# Patient Record
Sex: Female | Born: 1995
Health system: Southern US, Community
[De-identification: ages and names within clinical notes are randomized; demographics above are authoritative.]

## PROBLEM LIST (undated history)

## (undated) DIAGNOSIS — G43909 Migraine, unspecified, not intractable, without status migrainosus: Secondary | ICD-10-CM

## (undated) DIAGNOSIS — K219 Gastro-esophageal reflux disease without esophagitis: Secondary | ICD-10-CM

## (undated) DIAGNOSIS — Z91018 Allergy to other foods: Secondary | ICD-10-CM

## (undated) DIAGNOSIS — F329 Major depressive disorder, single episode, unspecified: Secondary | ICD-10-CM

## (undated) DIAGNOSIS — F32A Depression, unspecified: Secondary | ICD-10-CM

## (undated) DIAGNOSIS — K529 Noninfective gastroenteritis and colitis, unspecified: Secondary | ICD-10-CM

## (undated) DIAGNOSIS — F419 Anxiety disorder, unspecified: Secondary | ICD-10-CM

## (undated) HISTORY — DX: Major depressive disorder, single episode, unspecified: F32.9

## (undated) HISTORY — DX: Anxiety disorder, unspecified: F41.9

## (undated) HISTORY — DX: Migraine, unspecified, not intractable, without status migrainosus: G43.909

## (undated) HISTORY — DX: Allergy to other foods: Z91.018

## (undated) HISTORY — DX: Noninfective gastroenteritis and colitis, unspecified: K52.9

## (undated) HISTORY — DX: Depression, unspecified: F32.A

## (undated) HISTORY — PX: OTHER SURGICAL HISTORY: SHX169

## (undated) HISTORY — DX: Gastro-esophageal reflux disease without esophagitis: K21.9

---
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
## 2014-02-03 ENCOUNTER — Other Ambulatory Visit: Payer: Self-pay | Admitting: Obstetrics and Gynecology

## 2014-02-03 NOTE — Telephone Encounter (Signed)
eScribe request from [facility] for refill on AZURETTE Last filled - 10/31/13 X 1 YEAR Last AEX - 03/30/14 Next AEX - 04/01/14  RX denied.  This was filled in 10/2013 x 1 year.

## 2014-03-08 ENCOUNTER — Other Ambulatory Visit: Payer: Self-pay | Admitting: Obstetrics and Gynecology

## 2014-03-09 NOTE — Telephone Encounter (Signed)
eScribe request from [facility] for refill on AZURETTE Last filled - 10/31/13, #1 X 11 to [facility] Last AEX - 03/30/13 Next AEX - 04/01/14 1 month supply sent to [facility].

## 2014-04-01 ENCOUNTER — Encounter: Payer: Self-pay | Admitting: Obstetrics and Gynecology

## 2014-04-01 ENCOUNTER — Ambulatory Visit (INDEPENDENT_AMBULATORY_CARE_PROVIDER_SITE_OTHER): Payer: Managed Care, Other (non HMO) | Admitting: Obstetrics and Gynecology

## 2014-04-01 VITALS — BP 110/60 | HR 82 | Temp 98.1°F | Ht 65.25 in | Wt 157.6 lb

## 2014-04-01 DIAGNOSIS — Z01419 Encounter for gynecological examination (general) (routine) without abnormal findings: Secondary | ICD-10-CM

## 2014-04-01 DIAGNOSIS — R5381 Other malaise: Secondary | ICD-10-CM

## 2014-04-01 DIAGNOSIS — Z113 Encounter for screening for infections with a predominantly sexual mode of transmission: Secondary | ICD-10-CM

## 2014-04-01 DIAGNOSIS — R5383 Other fatigue: Secondary | ICD-10-CM

## 2014-04-01 DIAGNOSIS — R635 Abnormal weight gain: Secondary | ICD-10-CM

## 2014-04-01 DIAGNOSIS — Z0000 Encounter for general adult medical examination without abnormal findings: Secondary | ICD-10-CM

## 2014-04-01 LAB — COMPREHENSIVE METABOLIC PANEL
ALK PHOS: 74 U/L (ref 39–117)
ALT: <8 U/L (ref 0–35)
AST: 14 U/L (ref 0–37)
Albumin: 3.7 g/dL (ref 3.5–5.2)
BILIRUBIN TOTAL: 0.4 mg/dL (ref 0.2–1.1)
BUN: 8 mg/dL (ref 6–23)
CO2: 25 meq/L (ref 19–32)
CREATININE: 0.7 mg/dL (ref 0.50–1.10)
Calcium: 9.1 mg/dL (ref 8.4–10.5)
Chloride: 102 mEq/L (ref 96–112)
GLUCOSE: 84 mg/dL (ref 70–99)
Potassium: 4.1 mEq/L (ref 3.5–5.3)
Sodium: 135 mEq/L (ref 135–145)
Total Protein: 6.7 g/dL (ref 6.0–8.3)

## 2014-04-01 LAB — POCT URINALYSIS DIPSTICK
Bilirubin, UA: NEGATIVE
Blood, UA: 250
Glucose, UA: NEGATIVE
KETONES UA: NEGATIVE
NITRITE UA: NEGATIVE
PH UA: 8
PROTEIN UA: NEGATIVE
Urobilinogen, UA: NEGATIVE

## 2014-04-01 LAB — LIPID PANEL
CHOL/HDL RATIO: 2.7 ratio
CHOLESTEROL: 172 mg/dL — AB (ref 0–169)
HDL: 64 mg/dL (ref >34)
LDL CALC: 85 mg/dL (ref 0–109)
TRIGLYCERIDES: 113 mg/dL (ref <150)
VLDL: 23 mg/dL (ref 0–40)

## 2014-04-01 MED ORDER — DESOGESTREL-ETHINYL ESTRADIOL 0.15-0.02/0.01 MG (21/5) PO TABS: 1.0000 | ORAL_TABLET | Freq: Every day | ORAL | Status: DC

## 2014-04-01 NOTE — Patient Instructions (Signed)

## 2014-04-01 NOTE — Progress Notes (Signed)
Patient ID: Karina RenshawShayna Riley, female   DOB: 12-27-1995, 18 y.o.   MRN: [id number] GYNECOLOGY VISIT  PC[facility]   [facility]  Referring provider:   HPI: 18 y.o.   Single  Caucasian  female   G0P0 with Patient's last menstrual period was 04/01/2014.   here for  AEX.  Less cramping on oral contraceptive pills. Bleeding improved with most cycles.  One missed pill.  Wants STD testing.   Cannot loose weight.  Fatigue.  No constipation.  No cold intolerance.  Had normal thyroid ultrasound last year.   Hospitalized for alcohol abuse in December 2014.  Was in stressful relationship.  Seeing a Veterinary surgeoncounselor.  Doing much better.  In a new and supportive relationship.  [place] is being deployed to [place].  Hgb:   12.4 Urine:  Mod. RBC's--see LMP  GYNECOLOGIC HISTORY: Patient's last menstrual period was 04/01/2014. Sexually active:  yes Partner preference: female Contraception: Ocp's & condoms    Menopausal hormone therapy: n/a DES exposure:  no  Blood transfusions:  no  Sexually transmitted diseases:   no GYN procedures and prior surgeries:  labioplasty Last mammogram: n/a                Last pap and high risk HPV testing:   n/a History of abnormal pap smear:  n/a   OB History   Grav Para Term Preterm Abortions TAB SAB Ect Mult Living   0                LIFESTYLE: Exercise:     Work horses          Tobacco:     no Alcohol:        no Drug use:    no  OTHER HEALTH MAINTENANCE: Tetanus/TDap:   Up to date with PCP Gardisil:                completed Influenza:              no Zostavax:              n/a  Bone density:        n/a Colonoscopy:        n/a  Cholesterol check:   unsure  Family History  Problem Relation Age of Onset  . Osteoarthritis Mother   . Migraines Mother   . Osteoarthritis Father   . Osteoarthritis Maternal Grandfather   . Hypertension Maternal Grandfather   . Hyperlipidemia Maternal Grandfather   . Stroke Maternal Grandfather   . Thyroid disease  Maternal Grandfather   . Throat cancer Maternal Grandfather   . Osteoarthritis Paternal Grandmother   . Osteoarthritis Paternal Grandfather     Patient Active Problem List   Diagnosis Date Noted  . Depression with suicidal ideation 10/27/2013   Past Medical History  Diagnosis Date  . GERD (gastroesophageal reflux disease)   . Anxiety   . Depression   . Migraine     Past Surgical History  Procedure Laterality Date  . Wisdom teeth removal    . Eustacian tubes as infant    . Adenoidectomy  1998  . Labioplasty Right 04/29/2013    Procedure: right labia reduction;  Surgeon: Melony OverlyBrook A Silva, MD;  Location: [facility];  Service: Gynecology;  Laterality: Right;    ALLERGIES: Review of patient's allergies indicates no known allergies.  Current Outpatient Prescriptions  Medication Sig Dispense Refill  . AZURETTE 0.15-0.02/0.01 MG (21/5) tablet TAKE ONE TABLET BY MOUTH EVERY DAY  28 tablet  0  . escitalopram (LEXAPRO) 10 MG tablet Take 1 tablet (10 mg total) by mouth daily.  30 tablet  0  . omeprazole (PRILOSEC) 10 MG capsule Take 1 capsule (10 mg total) by mouth daily.       No current facility-administered medications for this visit.     ROS:  Pertinent items are noted in HPI.  SOCIAL HISTORY:  Works in [employer].   PHYSICAL EXAMINATION:    BP 110/60  Pulse 82  Temp(Src) 98.1 F (36.7 C)  Ht 5' 5.25" (1.657 m)  Wt 157 lb 9.6 oz (71.487 kg)  BMI 26.04 kg/m2  LMP 04/01/2014   Wt Readings from Last 3 Encounters:  04/01/14 157 lb 9.6 oz (71.487 kg) (87%*, Z = 1.14)  10/01/13 144 lb (65.318 kg) (78%*, Z = 0.79)  06/08/13 152 lb (68.947 kg) (86%*, Z = 1.06)   * Growth percentiles are based on CDC 2-20 Years data.     Ht Readings from Last 3 Encounters:  04/01/14 5' 5.25" (1.657 m) (65%*, Z = 0.38)  10/27/13 6\' 4"  (1.93 m) (100%*, Z = 4.63)  10/01/13 5\' 5"  (1.651 m) (62%*, Z = 0.30)   * Growth percentiles are based on CDC 2-20 Years data.    General appearance: alert,  cooperative and appears stated age Head: Normocephalic, without obvious abnormality, atraumatic Neck: no adenopathy, supple, symmetrical, trachea midline and thyroid not enlarged, symmetric, no tenderness/mass/nodules, neck seems thick in region of thyroid. Lungs: clear to auscultation bilaterally Breasts: Inspection negative, No nipple retraction or dimpling, No nipple discharge or bleeding, No axillary or supraclavicular adenopathy, Normal to palpation without dominant masses Heart: regular rate and rhythm Abdomen: soft, non-tender; no masses,  no organomegaly Extremities: extremities normal, atraumatic, no cyanosis or edema Skin: Skin color, texture, turgor normal. No rashes or lesions Lymph nodes: Cervical, supraclavicular, and axillary nodes normal. No abnormal inguinal nodes palpated Neurologic: Grossly normal  Pelvic: External genitalia:  no lesions              Urethra:  normal appearing urethra with no masses, tenderness or lesions              Bartholins and Skenes: normal                 Vagina: normal appearing vagina with normal color and discharge, no lesions              Cervix: normal appearance              Pap and high risk HPV testing done: no.            Bimanual Exam:  Uterus:  uterus is normal size, shape, consistency and nontender                                      Adnexa: normal adnexa in size, nontender and no masses                                    ASSESSMENT  Normal gynecologic exam. Weight gain.  Fatigue.  Desire for STD testing.   PLAN  Pap smear and high risk HPV testing not indicated.  STD testing.  Azurette OCP for 1 year.  See orders.  CMP, TSH, lipid penal.  Counseled on self breast exam, Calcium  and vitamin D intake, exercise, and weight loss. Return annually or prn   An After Visit Summary was printed and given to the patient.

## 2014-04-02 LAB — HEMOGLOBIN, FINGERSTICK: HEMOGLOBIN, FINGERSTICK: 12.4 g/dL (ref 12.0–16.0)

## 2014-04-02 LAB — GC/CHLAMYDIA PROBE AMP, URINE
Chlamydia, Swab/Urine, PCR: NEGATIVE
GC Probe Amp, Urine: NEGATIVE

## 2014-04-02 LAB — TSH: TSH: 1.009 u[IU]/mL (ref 0.350–4.500)

## 2014-04-02 LAB — STD PANEL
HIV 1&2 Ab, 4th Generation: NONREACTIVE
Hepatitis B Surface Ag: NEGATIVE

## 2014-04-02 LAB — HEPATITIS C ANTIBODY: HCV Ab: NEGATIVE

## 2014-11-12 IMAGING — US US SOFT TISSUE HEAD/NECK
1 series · 14 of 25 positions shown · non-contrast
Comparison: None.

CLINICAL DATA: Thyromegaly

THYROID ULTRASOUND
TECHNIQUE: Ultrasound examination of the thyroid gland and adjacent
soft tissues was performed.

[Series 1: us soft tissue head/neck · 0.04mm/px · 14 of 36 slices shown]
[im 1/36]
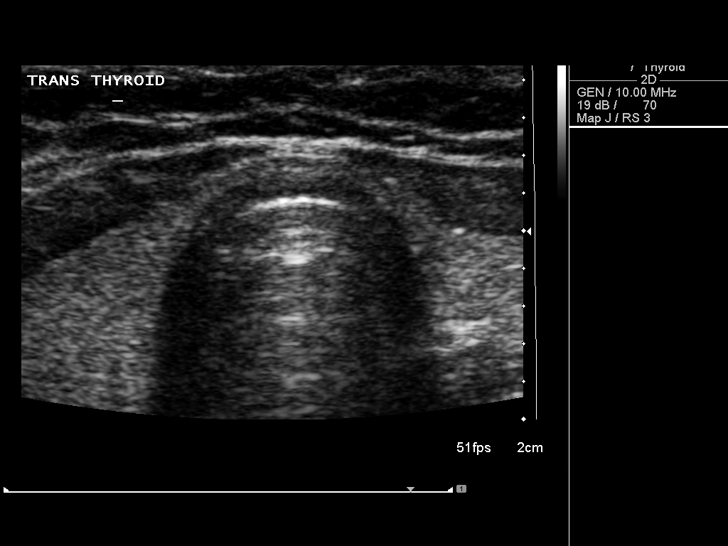
[im 3/36]
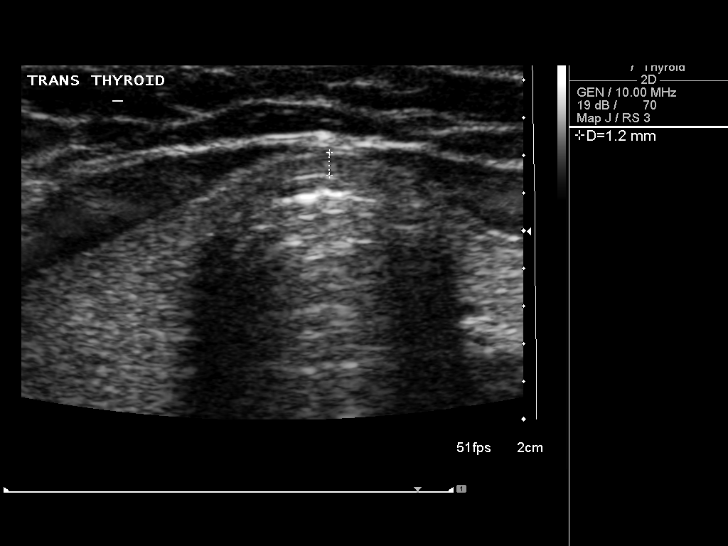
[im 6/36]
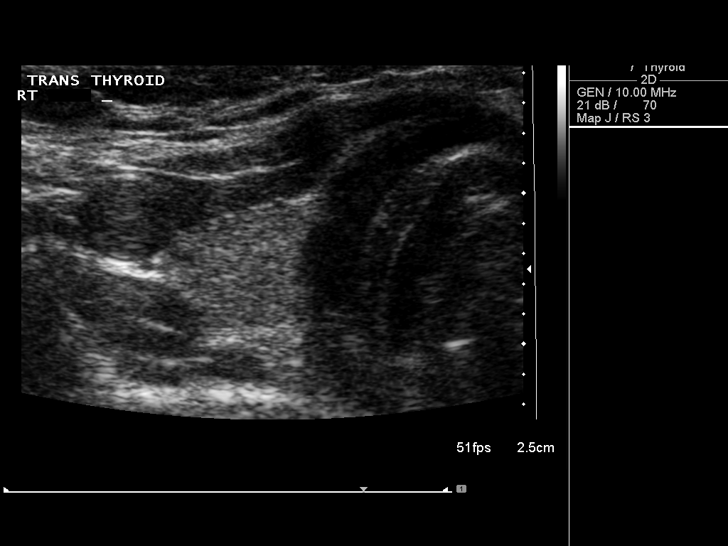
[im 9/36]
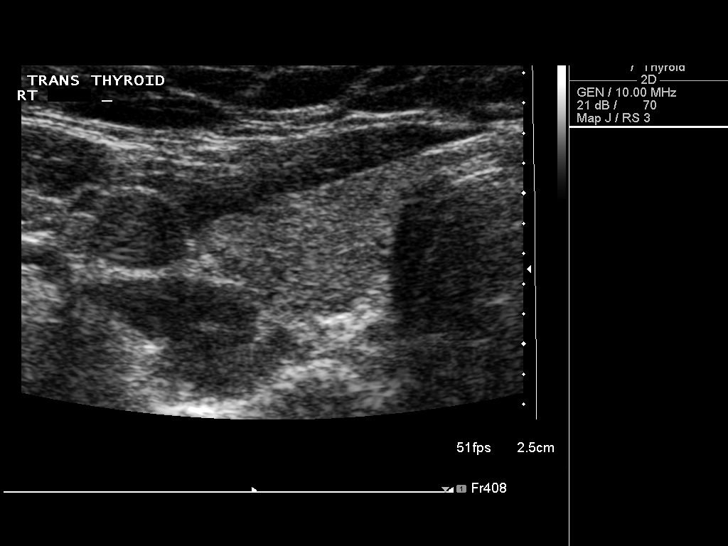
[im 12/36]
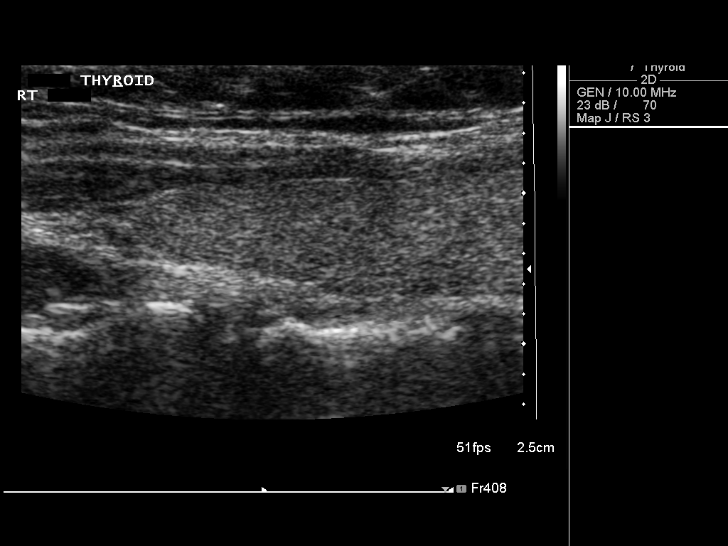
[im 14/36]
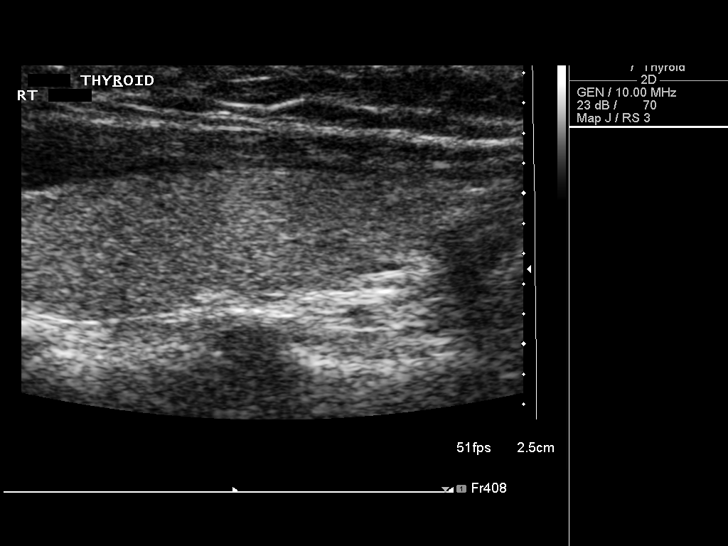
[im 17/36]
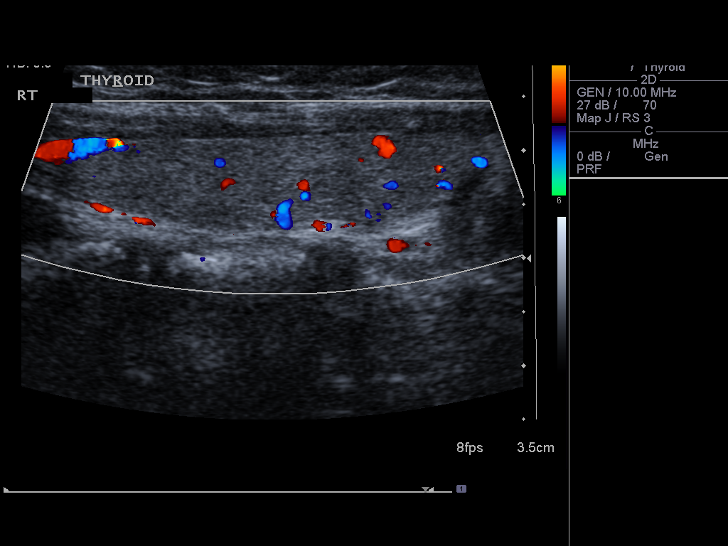
[im 19/36]
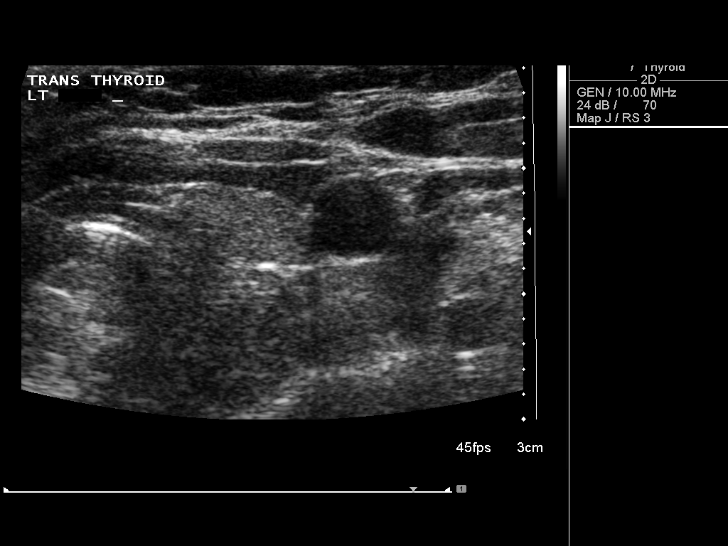
[im 22/36]
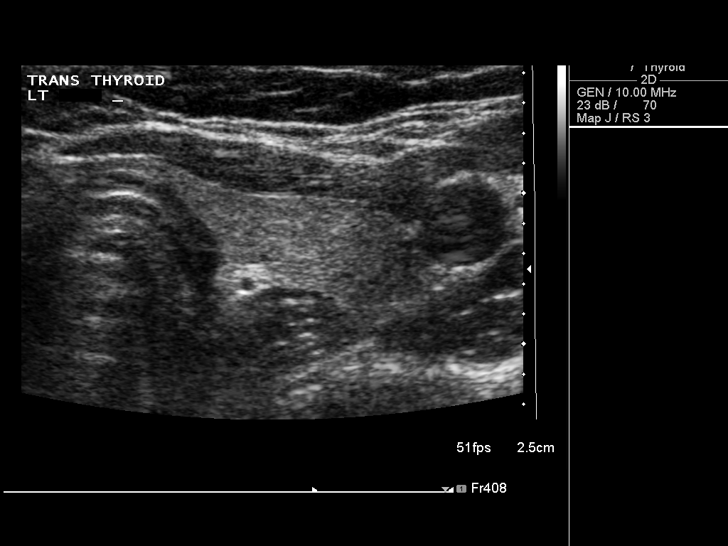
[im 24/36]
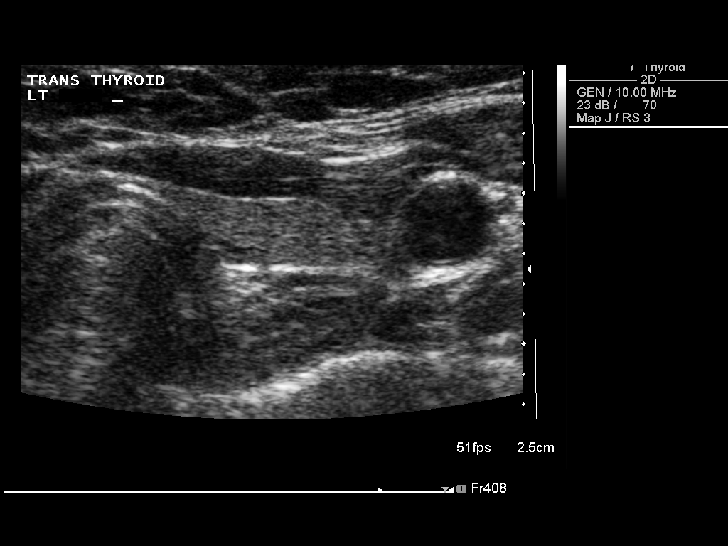
[im 27/36]
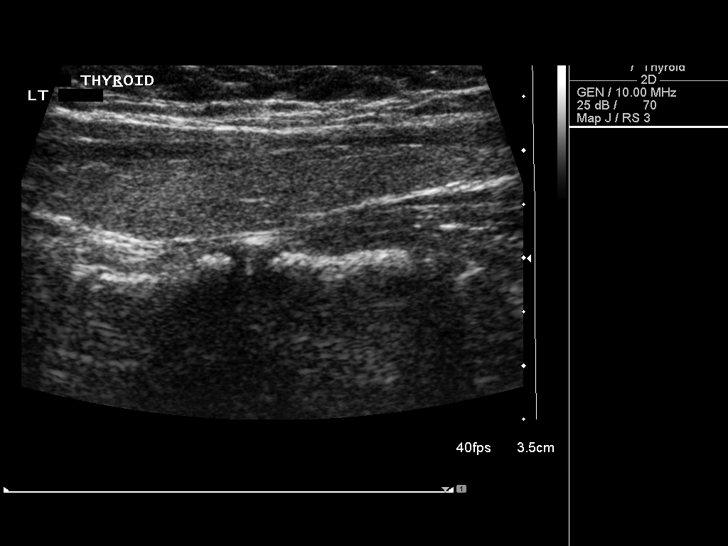
[im 30/36]
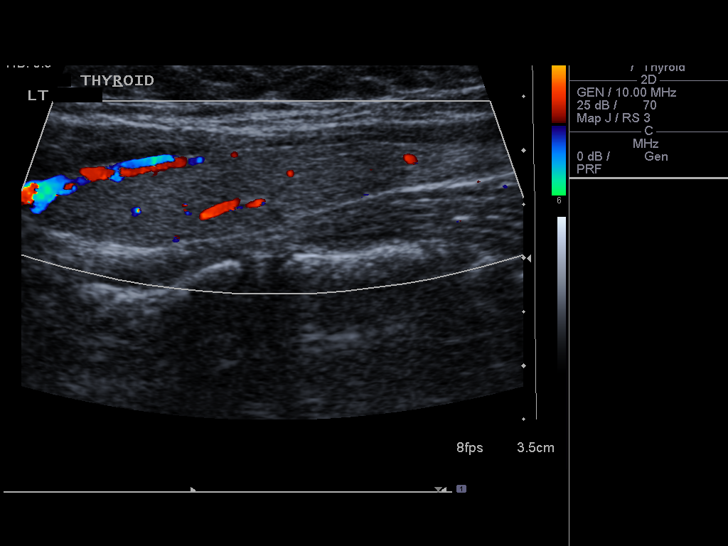
[im 33/36]
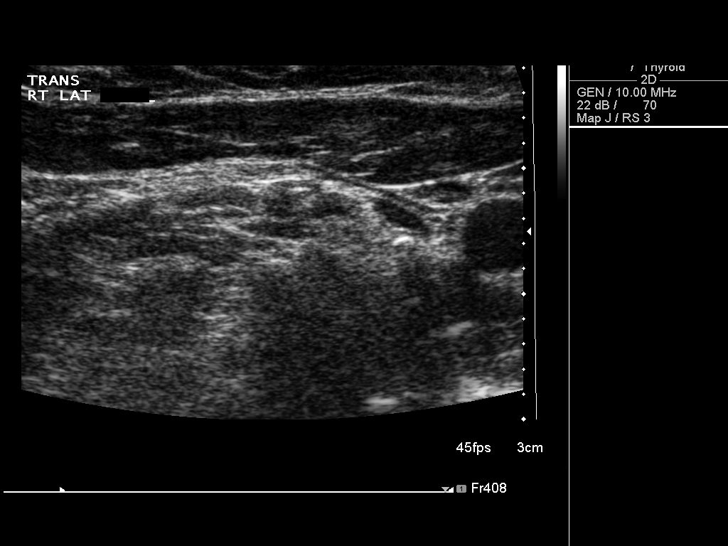
[im 36/36]
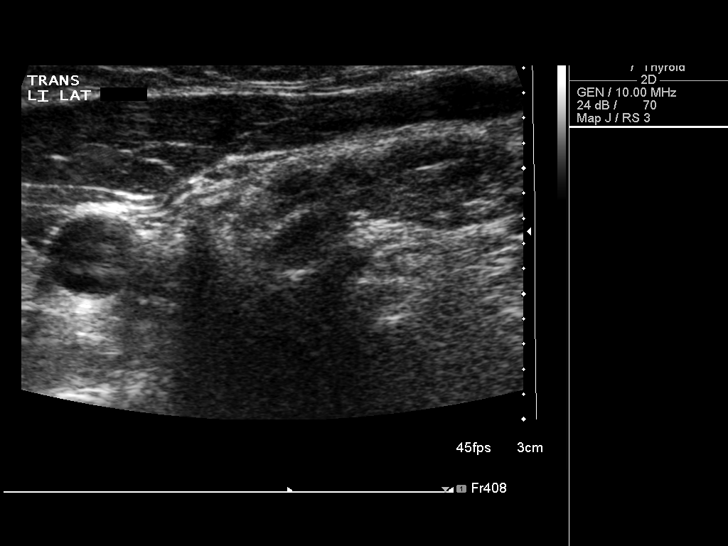

[14 of 25 positions shown; findings below may reference images not displayed]

FINDINGS: Right thyroid lobe:  9 x 13 x 42 mm, homogeneous echotexture
Left thyroid lobe:  8 x 13 x 45 mm
Isthmus:  1.2 mm in thickness

Focal nodules:  None

Lymphadenopathy:  None visualized.
IMPRESSION: 1.  Normal sized thyroid without focal lesions

## 2014-12-09 ENCOUNTER — Other Ambulatory Visit: Payer: Self-pay | Admitting: Obstetrics and Gynecology

## 2014-12-10 NOTE — Telephone Encounter (Signed)
Medication refill request: Azurette  Last AEX:  04/01/14 Next AEX: 04/13/15 Last MMG (if hormonal medication request): none Refill authorized: 04/01/14 #3packs/3 R. Patient should have enough refills until 03/2015. Rx denied

## 2015-03-01 ENCOUNTER — Other Ambulatory Visit: Payer: Self-pay | Admitting: Obstetrics and Gynecology

## 2015-03-01 NOTE — Telephone Encounter (Signed)
Medication refill request: Azurette Last AEX:  04/01/14 Dr. Edward JollySilva Next AEX: 04/13/15 Dr. Edward JollySilva  Last MMG (if hormonal medication request): none Refill authorized: 04/01/14 #3packs/ 3 R.  [facility]

## 2015-04-13 ENCOUNTER — Ambulatory Visit (INDEPENDENT_AMBULATORY_CARE_PROVIDER_SITE_OTHER): Payer: Managed Care, Other (non HMO) | Admitting: Obstetrics and Gynecology

## 2015-04-13 ENCOUNTER — Encounter: Payer: Self-pay | Admitting: Obstetrics and Gynecology

## 2015-04-13 VITALS — BP 102/60 | HR 88 | Resp 14 | Ht 66.0 in | Wt 155.0 lb

## 2015-04-13 DIAGNOSIS — Z0000 Encounter for general adult medical examination without abnormal findings: Secondary | ICD-10-CM

## 2015-04-13 DIAGNOSIS — Z113 Encounter for screening for infections with a predominantly sexual mode of transmission: Secondary | ICD-10-CM | POA: Diagnosis not present

## 2015-04-13 DIAGNOSIS — R319 Hematuria, unspecified: Secondary | ICD-10-CM

## 2015-04-13 DIAGNOSIS — Z01419 Encounter for gynecological examination (general) (routine) without abnormal findings: Secondary | ICD-10-CM

## 2015-04-13 LAB — POCT URINALYSIS DIPSTICK
Bilirubin, UA: NEGATIVE
Glucose, UA: NEGATIVE
Ketones, UA: NEGATIVE
Nitrite, UA: NEGATIVE
Protein, UA: NEGATIVE
Spec Grav, UA: 1.015
UROBILINOGEN UA: NEGATIVE
pH, UA: 6.5

## 2015-04-13 LAB — CBC
HCT: 36.8 % (ref 36.0–46.0)
Hemoglobin: 12.1 g/dL (ref 12.0–15.0)
MCH: 27.4 pg (ref 26.0–34.0)
MCHC: 32.9 g/dL (ref 30.0–36.0)
MCV: 83.3 fL (ref 78.0–100.0)
MPV: 9.5 fL (ref 8.6–12.4)
PLATELETS: 362 10*3/uL (ref 150–400)
RBC: 4.42 MIL/uL (ref 3.87–5.11)
RDW: 13.6 % (ref 11.5–15.5)
WBC: 7.8 10*3/uL (ref 4.0–10.5)

## 2015-04-13 LAB — LIPID PANEL
CHOLESTEROL: 169 mg/dL (ref 0–200)
HDL: 54 mg/dL (ref 36–76)
LDL CALC: 75 mg/dL (ref 0–99)
TRIGLYCERIDES: 201 mg/dL — AB (ref <150)
Total CHOL/HDL Ratio: 3.1 Ratio
VLDL: 40 mg/dL (ref 0–40)

## 2015-04-13 LAB — COMPREHENSIVE METABOLIC PANEL
AST: 14 U/L (ref 0–37)
Albumin: 3.9 g/dL (ref 3.5–5.2)
Alkaline Phosphatase: 80 U/L (ref 39–117)
BUN: 10 mg/dL (ref 6–23)
CHLORIDE: 103 meq/L (ref 96–112)
CO2: 23 meq/L (ref 19–32)
Calcium: 9.3 mg/dL (ref 8.4–10.5)
Creat: 0.68 mg/dL (ref 0.50–1.10)
GLUCOSE: 91 mg/dL (ref 70–99)
Potassium: 4.2 mEq/L (ref 3.5–5.3)
Sodium: 139 mEq/L (ref 135–145)
Total Bilirubin: 0.2 mg/dL (ref 0.2–1.1)
Total Protein: 7 g/dL (ref 6.0–8.3)

## 2015-04-13 LAB — HEMOGLOBIN, FINGERSTICK: Hemoglobin, fingerstick: 12.3 g/dL (ref 12.0–16.0)

## 2015-04-13 LAB — HEPATITIS C ANTIBODY: HCV Ab: NEGATIVE

## 2015-04-13 LAB — TSH: TSH: 2.881 u[IU]/mL (ref 0.350–4.500)

## 2015-04-13 MED ORDER — DESOGESTREL-ETHINYL ESTRADIOL 0.15-0.02/0.01 MG (21/5) PO TABS: 1.0000 | ORAL_TABLET | Freq: Every day | ORAL | Status: DC

## 2015-04-13 NOTE — Progress Notes (Signed)
Patient ID: Karina RenshawShayna Riley, female   DOB: 06/26/1996, 19 y.o.   MRN: [id number] 19 y.o. G0P0 Single Caucasian female here for annual exam.    Menses are monthly.  No missed pills.  Not sexually active since Christmas.  Applying [facility] [employer] now. Has been in flux with work and college plans.  Horse just recovered from surgery due to a leg injury.  She and horse are the same age and have always been together.   Recent laryngitis and ring worm.  Everything was treated with Penicillin and antifungal cream and vinegar.   Patient had normal thyroid ultrasound in 2014.  Normal TSH in 2015.   Mother having testing for potential diabetes.   PCP:   [facility]   Patient's last menstrual period was 03/30/2015.          Sexually active: Yes.    The current method of family planning is OCP (estrogen/progesterone) and condoms.    Exercising: Yes.    horse back riding, light weights and walking Smoker:  no  Health Maintenance: Pap:  Never  History of abnormal Pap:  no MMG:  never Colonoscopy:  never BMD:   never TDaP:  01/2015 Screening Labs:  Hb today: 12.3, Urine today: WBC ++ RBC ++   reports that she has never smoked. She has never used smokeless tobacco. She reports that she does not drink alcohol or use illicit drugs.  Past Medical History  Diagnosis Date  . GERD (gastroesophageal reflux disease)   . Anxiety   . Depression   . Migraine     Past Surgical History  Procedure Laterality Date  . Wisdom teeth removal    . Eustacian tubes as infant    . Adenoidectomy  1998  . Labioplasty Right 04/29/2013    Procedure: right labia reduction;  Surgeon: Melony OverlyBrook A Silva, MD;  Location: [facility];  Service: Gynecology;  Laterality: Right;    Current Outpatient Prescriptions  Medication Sig Dispense Refill  . AZURETTE 0.15-0.02/0.01 MG (21/5) tablet TAKE ONE TABLET BY MOUTH DAILY 84 tablet 0  . omeprazole (PRILOSEC) 10 MG capsule Take 1 capsule (10 mg total) by mouth daily.     No current  facility-administered medications for this visit.    Family History  Problem Relation Age of Onset  . Osteoarthritis Mother   . Migraines Mother   . Diabetes Mother   . Osteoarthritis Father   . Osteoarthritis Maternal Grandfather   . Hypertension Maternal Grandfather   . Hyperlipidemia Maternal Grandfather   . Stroke Maternal Grandfather   . Thyroid disease Maternal Grandfather   . Throat cancer Maternal Grandfather   . Osteoarthritis Paternal Grandmother   . Osteoarthritis Paternal Grandfather     ROS:  Pertinent items are noted in HPI.  Otherwise, a comprehensive ROS was negative.  Exam:   BP 102/60 mmHg  Pulse 88  Resp 14  Ht 5\' 6"  (1.676 m)  Wt 155 lb (70.308 kg)  BMI 25.03 kg/m2  LMP 03/30/2015    General appearance: alert, cooperative and appears stated age Head: Normocephalic, without obvious abnormality, atraumatic Neck: no adenopathy, supple, symmetrical, trachea midline and thyroid enlarged Lungs: clear to auscultation bilaterally Breasts: normal appearance, no masses or tenderness, Inspection negative, No nipple retraction or dimpling, No nipple discharge or bleeding, No axillary or supraclavicular adenopathy Heart: regular rate and rhythm Abdomen: soft, non-tender; bowel sounds normal; no masses,  no organomegaly Extremities: extremities normal, atraumatic, no cyanosis or edema Skin: Skin color, texture, turgor normal. No rashes  or lesions Lymph nodes: Cervical, supraclavicular, and axillary nodes normal. No abnormal inguinal nodes palpated Neurologic: Grossly normal  Pelvic: External genitalia:  no lesions              Urethra:  normal appearing urethra with no masses, tenderness or lesions              Bartholins and Skenes: normal                 Vagina: normal appearing vagina with normal color and discharge, no lesions              Cervix: no lesions              Pap taken: No. Bimanual Exam:  Uterus:  normal size, contour, position, consistency,  mobility, non-tender              Adnexa: normal adnexa and no mass, fullness, tenderness              Rectovaginal: No..    Chaperone was present for exam.  Assessment:   Well woman visit with normal exam. Abnormal urine.  Asymptomatic. Need for STD testing.  Thyromegaly on exam.  Normal ultrasound. Normal previous TSH testing.  Situational stress.   Plan: Yearly mammogram recommended after age 24.  Recommended self breast exam.  Pap and HR HPV as above. Discussed regular exercise program including cardiovascular and weight bearing exercise. Labs performed.  Yes.  .   See orders. Refills given on medications.  Yes.  .  See orders. No thyroid ultrasound needed. Support given.  Follow up annually and prn.      After visit summary provided.

## 2015-04-13 NOTE — Patient Instructions (Signed)

## 2015-04-14 LAB — STD PANEL
HIV: NONREACTIVE
Hepatitis B Surface Ag: NEGATIVE

## 2015-04-14 LAB — URINE CULTURE

## 2015-04-14 LAB — URINALYSIS, MICROSCOPIC ONLY
CASTS: NONE SEEN
Crystals: NONE SEEN
WBC, UA: >50 WBC/hpf — AB (ref <3)

## 2015-04-15 ENCOUNTER — Telehealth: Payer: Self-pay

## 2015-04-15 LAB — IPS N GONORRHOEA AND CHLAMYDIA BY PCR

## 2015-04-15 NOTE — Telephone Encounter (Signed)
Spoke with patient. Advised of results and message as seen below from Dr.Silva. Patient is agreeable and verbalizes understanding.  Routing to provider for final review. Patient agreeable to disposition. Will close encounter   

## 2015-04-15 NOTE — Telephone Encounter (Signed)
-----   Message from Patton SallesBrook E Amundson C Silva, MD sent at 04/14/2015  9:50 PM EDT ----- Please inform patient of cholesterol panel showing elevated triglycerides.  Ways to lower this are to increase exercise and follow a diet low in fats. (Doing fasting labs also helps!) The actual cholesterol numbers were normal.    The thyroid, blood chemistries, blood counts, and blood STD testing were all negative and normal.  The final urine culture and GC/CT are pending.   Cc - Claudette LawsAmanda Dixon

## 2016-02-03 ENCOUNTER — Other Ambulatory Visit: Payer: Self-pay | Admitting: Obstetrics and Gynecology

## 2016-02-03 NOTE — Telephone Encounter (Signed)
Medication refill request: OCP Last AEX:  04/13/15 Dr. Edward JollySilva Next AEX: 04/13/16  Last MMG (if hormonal medication request): none Refill authorized: 04/13/15 #84tab/3R. To [facility]

## 2016-04-13 ENCOUNTER — Ambulatory Visit (INDEPENDENT_AMBULATORY_CARE_PROVIDER_SITE_OTHER): Payer: BLUE CROSS/BLUE SHIELD | Admitting: Obstetrics and Gynecology

## 2016-04-13 ENCOUNTER — Encounter: Payer: Self-pay | Admitting: Obstetrics and Gynecology

## 2016-04-13 VITALS — BP 102/70 | HR 90 | Resp 14 | Ht 66.0 in | Wt 161.8 lb

## 2016-04-13 DIAGNOSIS — Z113 Encounter for screening for infections with a predominantly sexual mode of transmission: Secondary | ICD-10-CM

## 2016-04-13 DIAGNOSIS — Z01419 Encounter for gynecological examination (general) (routine) without abnormal findings: Secondary | ICD-10-CM | POA: Diagnosis not present

## 2016-04-13 DIAGNOSIS — Z0000 Encounter for general adult medical examination without abnormal findings: Secondary | ICD-10-CM

## 2016-04-13 LAB — CBC
HEMATOCRIT: 35.5 % (ref 35.0–45.0)
Hemoglobin: 11.4 g/dL — ABNORMAL LOW (ref 11.7–15.5)
MCH: 27.1 pg (ref 27.0–33.0)
MCHC: 32.1 g/dL (ref 32.0–36.0)
MCV: 84.3 fL (ref 80.0–100.0)
MPV: 9.7 fL (ref 7.5–12.5)
Platelets: 344 10*3/uL (ref 140–400)
RBC: 4.21 MIL/uL (ref 3.80–5.10)
RDW: 13.5 % (ref 11.0–15.0)
WBC: 9.7 10*3/uL (ref 3.8–10.8)

## 2016-04-13 LAB — POCT URINALYSIS DIPSTICK
Bilirubin, UA: NEGATIVE
GLUCOSE UA: NEGATIVE
Ketones, UA: NEGATIVE
Leukocytes, UA: NEGATIVE
NITRITE UA: NEGATIVE
PROTEIN UA: NEGATIVE
RBC UA: NEGATIVE
UROBILINOGEN UA: NEGATIVE
pH, UA: 5

## 2016-04-13 MED ORDER — LEVONORGESTREL-ETHINYL ESTRAD 0.1-20 MG-MCG PO TABS: 1.0000 | ORAL_TABLET | Freq: Every day | ORAL | Status: DC

## 2016-04-13 NOTE — Patient Instructions (Signed)
Health Maintenance, Female Adopting a healthy lifestyle and getting preventive care can go a long way to promote health and wellness. Talk with your health care provider about what schedule of regular examinations is right for you. This is a good chance for you to check in with your provider about disease prevention and staying healthy. In between checkups, there are plenty of things you can do on your own. Experts have done a lot of research about which lifestyle changes and preventive measures are most likely to keep you healthy. Ask your health care provider for more information. WEIGHT AND DIET  Eat a healthy diet  Be sure to include plenty of vegetables, fruits, low-fat dairy products, and lean protein.  Do not eat a lot of foods high in solid fats, added sugars, or salt.  Get regular exercise. This is one of the most important things you can do for your health.  Most adults should exercise for at least 150 minutes each week. The exercise should increase your heart rate and make you sweat (moderate-intensity exercise).  Most adults should also do strengthening exercises at least twice a week. This is in addition to the moderate-intensity exercise.  Maintain a healthy weight  Body mass index (BMI) is a measurement that can be used to identify possible weight problems. It estimates body fat based on height and weight. Your health care provider can help determine your BMI and help you achieve or maintain a healthy weight.  For females 20 years of age and older:   A BMI below 18.5 is considered underweight.  A BMI of 18.5 to 24.9 is normal.  A BMI of 25 to 29.9 is considered overweight.  A BMI of 30 and above is considered obese.  Watch levels of cholesterol and blood lipids  You should start having your blood tested for lipids and cholesterol at 20 years of age, then have this test every 5 years.  You may need to have your cholesterol levels checked more often if:  Your lipid  or cholesterol levels are high.  You are older than 21 years of age.  You are at high risk for heart disease.  CANCER SCREENING   Lung Cancer  Lung cancer screening is recommended for adults 55-80 years old who are at high risk for lung cancer because of a history of smoking.  A yearly low-dose CT scan of the lungs is recommended for people who:  Currently smoke.  Have quit within the past 15 years.  Have at least a 30-pack-year history of smoking. A pack year is smoking an average of one pack of cigarettes a day for 1 year.  Yearly screening should continue until it has been 15 years since you quit.  Yearly screening should stop if you develop a health problem that would prevent you from having lung cancer treatment.  Breast Cancer  Practice breast self-awareness. This means understanding how your breasts normally appear and feel.  It also means doing regular breast self-exams. Let your health care provider know about any changes, no matter how small.  If you are in your 20s or 30s, you should have a clinical breast exam (CBE) by a health care provider every 1-3 years as part of a regular health exam.  If you are 40 or older, have a CBE every year. Also consider having a breast X-ray (mammogram) every year.  If you have a family history of breast cancer, talk to your health care provider about genetic screening.  If you   are at high risk for breast cancer, talk to your health care provider about having an MRI and a mammogram every year.  Breast cancer gene (BRCA) assessment is recommended for women who have family members with BRCA-related cancers. BRCA-related cancers include:  Breast.  Ovarian.  Tubal.  Peritoneal cancers.  Results of the assessment will determine the need for genetic counseling and BRCA1 and BRCA2 testing. Cervical Cancer Your health care provider may recommend that you be screened regularly for cancer of the pelvic organs (ovaries, uterus, and  vagina). This screening involves a pelvic examination, including checking for microscopic changes to the surface of your cervix (Pap test). You may be encouraged to have this screening done every 3 years, beginning at age 21.  For women ages 30-65, health care providers may recommend pelvic exams and Pap testing every 3 years, or they may recommend the Pap and pelvic exam, combined with testing for human papilloma virus (HPV), every 5 years. Some types of HPV increase your risk of cervical cancer. Testing for HPV may also be done on women of any age with unclear Pap test results.  Other health care providers may not recommend any screening for nonpregnant women who are considered low risk for pelvic cancer and who do not have symptoms. Ask your health care provider if a screening pelvic exam is right for you.  If you have had past treatment for cervical cancer or a condition that could lead to cancer, you need Pap tests and screening for cancer for at least 20 years after your treatment. If Pap tests have been discontinued, your risk factors (such as having a new sexual partner) need to be reassessed to determine if screening should resume. Some women have medical problems that increase the chance of getting cervical cancer. In these cases, your health care provider may recommend more frequent screening and Pap tests. Colorectal Cancer  This type of cancer can be detected and often prevented.  Routine colorectal cancer screening usually begins at 21 years of age and continues through 21 years of age.  Your health care provider may recommend screening at an earlier age if you have risk factors for colon cancer.  Your health care provider may also recommend using home test kits to check for hidden blood in the stool.  A small camera at the end of a tube can be used to examine your colon directly (sigmoidoscopy or colonoscopy). This is done to check for the earliest forms of colorectal  cancer.  Routine screening usually begins at age 50.  Direct examination of the colon should be repeated every 5-10 years through 21 years of age. However, you may need to be screened more often if early forms of precancerous polyps or small growths are found. Skin Cancer  Check your skin from head to toe regularly.  Tell your health care provider about any new moles or changes in moles, especially if there is a change in a mole's shape or color.  Also tell your health care provider if you have a mole that is larger than the size of a pencil eraser.  Always use sunscreen. Apply sunscreen liberally and repeatedly throughout the day.  Protect yourself by wearing long sleeves, pants, a wide-brimmed hat, and sunglasses whenever you are outside. HEART DISEASE, DIABETES, AND HIGH BLOOD PRESSURE   High blood pressure causes heart disease and increases the risk of stroke. High blood pressure is more likely to develop in:  People who have blood pressure in the high end   of the normal range (130-139/85-89 mm Hg).  People who are overweight or obese.  People who are African American.  If you are 38-23 years of age, have your blood pressure checked every 3-5 years. If you are 61 years of age or older, have your blood pressure checked every year. You should have your blood pressure measured twice--once when you are at a hospital or clinic, and once when you are not at a hospital or clinic. Record the average of the two measurements. To check your blood pressure when you are not at a hospital or clinic, you can use:  An automated blood pressure machine at a pharmacy.  A home blood pressure monitor.  If you are between 45 years and 39 years old, ask your health care provider if you should take aspirin to prevent strokes.  Have regular diabetes screenings. This involves taking a blood sample to check your fasting blood sugar level.  If you are at a normal weight and have a low risk for diabetes,  have this test once every three years after 21 years of age.  If you are overweight and have a high risk for diabetes, consider being tested at a younger age or more often. PREVENTING INFECTION  Hepatitis B  If you have a higher risk for hepatitis B, you should be screened for this virus. You are considered at high risk for hepatitis B if:  You were born in a country where hepatitis B is common. Ask your health care provider which countries are considered high risk.  Your parents were born in a high-risk country, and you have not been immunized against hepatitis B (hepatitis B vaccine).  You have HIV or AIDS.  You use needles to inject street drugs.  You live with someone who has hepatitis B.  You have had sex with someone who has hepatitis B.  You get hemodialysis treatment.  You take certain medicines for conditions, including cancer, organ transplantation, and autoimmune conditions. Hepatitis C  Blood testing is recommended for:  Everyone born from [birth year] through 1966.  Anyone with known risk factors for hepatitis C. Sexually transmitted infections (STIs)  You should be screened for sexually transmitted infections (STIs) including gonorrhea and chlamydia if:  You are sexually active and are younger than 21 years of age.  You are older than 21 years of age and your health care provider tells you that you are at risk for this type of infection.  Your sexual activity has changed since you were last screened and you are at an increased risk for chlamydia or gonorrhea. Ask your health care provider if you are at risk.  If you do not have HIV, but are at risk, it may be recommended that you take a prescription medicine daily to prevent HIV infection. This is called pre-exposure prophylaxis (PrEP). You are considered at risk if:  You are sexually active and do not regularly use condoms or know the HIV status of your partner(s).  You take drugs by injection.  You are sexually  active with a partner who has HIV. Talk with your health care provider about whether you are at high risk of being infected with HIV. If you choose to begin PrEP, you should first be tested for HIV. You should then be tested every 3 months for as long as you are taking PrEP.  PREGNANCY   If you are premenopausal and you may become pregnant, ask your health care provider about preconception counseling.  If you may  become pregnant, take 400 to 800 micrograms (mcg) of folic acid every day.  If you want to prevent pregnancy, talk to your health care provider about birth control (contraception). OSTEOPOROSIS AND MENOPAUSE   Osteoporosis is a disease in which the bones lose minerals and strength with aging. This can result in serious bone fractures. Your risk for osteoporosis can be identified using a bone density scan.  If you are 61 years of age or older, or if you are at risk for osteoporosis and fractures, ask your health care provider if you should be screened.  Ask your health care provider whether you should take a calcium or vitamin D supplement to lower your risk for osteoporosis.  Menopause may have certain physical symptoms and risks.  Hormone replacement therapy may reduce some of these symptoms and risks. Talk to your health care provider about whether hormone replacement therapy is right for you.  HOME CARE INSTRUCTIONS   Schedule regular health, dental, and eye exams.  Stay current with your immunizations.   Do not use any tobacco products including cigarettes, chewing tobacco, or electronic cigarettes.  If you are pregnant, do not drink alcohol.  If you are breastfeeding, limit how much and how often you drink alcohol.  Limit alcohol intake to no more than 1 drink per day for nonpregnant women. One drink equals 12 ounces of beer, 5 ounces of wine, or 1 ounces of hard liquor.  Do not use street drugs.  Do not share needles.  Ask your health care provider for help if  you need support or information about quitting drugs.  Tell your health care provider if you often feel depressed.  Tell your health care provider if you have ever been abused or do not feel safe at home.   This information is not intended to replace advice given to you by your health care provider. Make sure you discuss any questions you have with your health care provider.   Document Released: 05/14/2011 Document Revised: 11/19/2014 Document Reviewed: 09/30/2013 Elsevier Interactive Patient Education [employer].

## 2016-04-13 NOTE — Progress Notes (Signed)
Patient ID: Karina Riley, female   DOB: [birth year], 20 y.o.   MRN: [id number] 20 y.o. G0P0 Single Caucasian female here for annual exam.    Patient treated for a spider bite the first of May by PCP.  She is now complaining of rash and bites frequently on legs which she feels is coming from work at [employer].  On [facility] OCPs. Menses are occurring the first week of the pack. Last  7 - 8 days.  Bleeding is heavier now.  Changes 3 times per day.   Gaining weight.   Wants STD testing.  Last partner was unfaithful.  Doing summer session at school. Going to school and working two jobs.   PCP: Deboraha Sprang at [facility]   Patient's last menstrual period was 04/02/2016 (exact date).     Period Cycle (Days): 30 Period Duration (Days): 7-8 days Period Pattern: Regular Menstrual Flow: Heavy Menstrual Control: Tampon, Maxi pad Menstrual Control Change Freq (Hours): every 6 hours(overnight pads Dysmenorrhea: (!) Moderate Dysmenorrhea Symptoms: Cramping     Sexually active: No. female The current method of family planning is OCP (estrogen/progesterone)--Kariva.    Exercising: Yes.    rides horses. Smoker:  no  Health Maintenance: Pap:  Never History of abnormal Pap:  n/a MMG:  n/a Colonoscopy:  n/a BMD:   n/a  Result  n/a TDaP:  01/2015 Gardasil:   Yes, 2014  : Screening Labs:  Hb today: 11.5, Urine today: Neg   reports that she has never smoked. She has never used smokeless tobacco. She reports that she does not drink alcohol or use illicit drugs.  Past Medical History  Diagnosis Date  . GERD (gastroesophageal reflux disease)   . Anxiety   . Depression   . Migraine     Past Surgical History  Procedure Laterality Date  . Wisdom teeth removal    . Eustacian tubes as infant    . Adenoidectomy  1998  . Labioplasty Right 04/29/2013    Procedure: right labia reduction;  Surgeon: Melony Overly, MD;  Location: [facility];  Service: Gynecology;  Laterality: Right;    Current Outpatient  Prescriptions  Medication Sig Dispense Refill  . desogestrel-ethinyl estradiol (AZURETTE) 0.15-0.02/0.01 MG (21/5) tablet Take 1 tablet by mouth daily. 84 tablet 3  . omeprazole (PRILOSEC) 10 MG capsule Take 1 capsule (10 mg total) by mouth daily.     No current facility-administered medications for this visit.    Family History  Problem Relation Age of Onset  . Osteoarthritis Mother   . Migraines Mother   . Diabetes Mother   . Osteoarthritis Father   . Osteoarthritis Maternal Grandfather   . Hypertension Maternal Grandfather   . Hyperlipidemia Maternal Grandfather   . Stroke Maternal Grandfather   . Thyroid disease Maternal Grandfather   . Throat cancer Maternal Grandfather   . Osteoarthritis Paternal Grandmother   . Osteoarthritis Paternal Grandfather     ROS:  Pertinent items are noted in HPI.  Otherwise, a comprehensive ROS was negative.  Exam:   BP 102/70 mmHg  Pulse 90  Resp 14  Ht  (1.676 m)  Wt 161 lb 12.8 oz (73.392 kg)  BMI 26.13 kg/m2  LMP 04/02/2016 (Exact Date)    General appearance: alert, cooperative and appears stated age Head: Normocephalic, without obvious abnormality, atraumatic Neck: no adenopathy, supple, symmetrical, trachea midline and thyroid normal to inspection and palpation Lungs: clear to auscultation bilaterally Breasts: normal appearance, no masses or tenderness, Inspection negative, No nipple retraction  or dimpling, No nipple discharge or bleeding, No axillary or supraclavicular adenopathy Heart: regular rate and rhythm Abdomen: incisions:  No.    , soft, non-tender; no masses, no organomegaly Extremities: extremities normal, atraumatic, no cyanosis or edema Skin: Skin color, texture, turgor normal.  Rash around ankles. Lymph nodes: Cervical, supraclavicular, and axillary nodes normal. No abnormal inguinal nodes palpated Neurologic: Grossly normal  Pelvic: External genitalia:  no lesions              Urethra:  normal appearing  urethra with no masses, tenderness or lesions              Bartholins and Skenes: normal                 Vagina: normal appearing vagina with normal color and discharge, no lesions              Cervix: no lesions              Pap taken: No. Bimanual Exam:  Uterus:  normal size, contour, position, consistency, mobility, non-tender              Adnexa: no mass, fullness, tenderness           Chaperone was present for exam.  Assessment:   Well woman visit with normal exam. Weight gain and long cycles on current OCP. Desire for STD testing.   Plan: Yearly mammogram recommended after age 21.  Recommended self breast exam.  Pap and HR HPV as above. Discussed Calcium, Vitamin D, regular exercise program including cardiovascular and weight bearing exercise. Discussed weight loss through diet and exercise. Labs performed.  Yes.  .   See orders.  STD screening. Prescription medication(s) given.  Yes.  .  See orders.  Switch to Alesse OCPs.  Call for any problems with new pills. Benadryl cream or hydrocortisone cream to skin on ankles. Follow up annually and prn.       After visit summary provided.

## 2016-04-14 LAB — STD PANEL
HEP B S AG: NEGATIVE
HIV: NONREACTIVE

## 2016-04-15 LAB — GC/CHLAMYDIA PROBE AMP
CT Probe RNA: NOT DETECTED
GC PROBE AMP APTIMA: NOT DETECTED

## 2016-05-21 ENCOUNTER — Encounter: Payer: Self-pay | Admitting: Obstetrics and Gynecology

## 2016-05-21 ENCOUNTER — Telehealth: Payer: Self-pay | Admitting: Obstetrics and Gynecology

## 2016-05-21 ENCOUNTER — Ambulatory Visit (INDEPENDENT_AMBULATORY_CARE_PROVIDER_SITE_OTHER): Payer: BLUE CROSS/BLUE SHIELD | Admitting: Obstetrics and Gynecology

## 2016-05-21 VITALS — Ht 66.0 in | Wt 160.8 lb

## 2016-05-21 DIAGNOSIS — N764 Abscess of vulva: Secondary | ICD-10-CM

## 2016-05-21 MED ORDER — AMOXICILLIN-POT CLAVULANATE 875-125 MG PO TABS: 1.0000 | ORAL_TABLET | Freq: Two times a day (BID) | ORAL | Status: DC

## 2016-05-21 NOTE — Telephone Encounter (Signed)
Patient is having a lot of pain thinks it may be an ingrown hair around her vagina.

## 2016-05-21 NOTE — Telephone Encounter (Signed)
Patient complaints of two days of vaginal pain. She notes area on labia to be warm and tender, hard to walk. No STD concerns. No fevers, but feels warm to the touch in vaginal area.  Office visit today with Dr. Edward JollySilva at 1500 scheduled and patient agreeable.  Routing to provider for final review. Patient agreeable to disposition. Will close encounter.

## 2016-05-21 NOTE — Progress Notes (Signed)
Patient ID: Karina RenshawShayna Riley, female   DOB: [birth year], 20 y.o.   MRN: [id number] GYNECOLOGY  VISIT   HPI: 20 y.o.   Single  Caucasian  female   G0P0 with No LMP recorded.   here for evaluation of left vulvar mass which is very painful.   Not draining.  No fever.   Rides horses.   GYNECOLOGIC HISTORY: No LMP recorded. Contraception: OCPs--Kariva  Menopausal hormone therapy:  n/a Last mammogram:  n/a Last pap smear:   n/a        OB History    Gravida Para Term Preterm AB TAB SAB Ectopic Multiple Living   0                  Patient Active Problem List   Diagnosis Date Noted  . Depression with suicidal ideation 10/27/2013    Past Medical History  Diagnosis Date  . GERD (gastroesophageal reflux disease)   . Anxiety   . Depression   . Migraine     Past Surgical History  Procedure Laterality Date  . Wisdom teeth removal    . Eustacian tubes as infant    . Adenoidectomy  1998  . Labioplasty Right 04/29/2013    Procedure: right labia reduction;  Surgeon: Melony OverlyBrook A Silva, MD;  Location: [facility];  Service: Gynecology;  Laterality: Right;    Current Outpatient Prescriptions  Medication Sig Dispense Refill  . levonorgestrel-ethinyl estradiol (AVIANE,ALESSE,LESSINA) 0.1-20 MG-MCG tablet Take 1 tablet by mouth daily. 3 Package 3  . omeprazole (PRILOSEC) 10 MG capsule Take 1 capsule (10 mg total) by mouth daily.     No current facility-administered medications for this visit.     ALLERGIES: Review of patient's allergies indicates no known allergies.  Family History  Problem Relation Age of Onset  . Osteoarthritis Mother   . Migraines Mother   . Diabetes Mother   . Osteoarthritis Father   . Osteoarthritis Maternal Grandfather   . Hypertension Maternal Grandfather   . Hyperlipidemia Maternal Grandfather   . Stroke Maternal Grandfather   . Thyroid disease Maternal Grandfather   . Throat cancer Maternal Grandfather   . Osteoarthritis Paternal Grandmother   . Osteoarthritis  Paternal Grandfather     Social History   Social History  . Marital Status: Single    Spouse Name: N/A  . Number of Children: N/A  . Years of Education: N/A   Occupational History  . Not on file.   Social History Main Topics  . Smoking status: Never Smoker   . Smokeless tobacco: Never Used  . Alcohol Use: No  . Drug Use: No  . Sexual Activity: No     Comment: Garnette Scheuermann[facility]   Other Topics Concern  . Not on file   Social History Narrative    ROS:  Pertinent items are noted in HPI.  PHYSICAL EXAMINATION:    Ht 5\' 6"  (1.676 m)  Wt 160 lb 12.8 oz (72.938 kg)  BMI 25.97 kg/m2    General appearance: alert, cooperative and appears stated age   Pelvic: External genitalia:   2 cm abscess of left superior vulva.  Procedure - I and D of vulvar abscess.  Verbal consent for procedure.  Sterile prep with betadine.  Local 1% lidocaine - [id number], exp 01/10/17.  Incision with 11 blade scalpel.  Pus drained.  Wound culture performed.  Abscess broken up with hemostat.  Area dressed with sterile gauze. No complications.  Minimal EBL.  Chaperone was present for exam.  ASSESSMENT  Vulvar abscess drained.   PLAN  Wound cx.  Augmentin po bid for 7 days.  I am not giving doxycycline or bactrim as the patient [place].  I am concerned about sun exposure of these. Use occlusive bandage on the area.  Follow up in one week.  Call if area worsens.    An After Visit Summary was printed and given to the patient.  __15____ minutes face to face time of which over 50% was spent in counseling.

## 2016-05-24 LAB — WOUND CULTURE: Gram Stain: NONE SEEN

## 2016-05-30 ENCOUNTER — Encounter: Payer: Self-pay | Admitting: Obstetrics and Gynecology

## 2016-05-30 ENCOUNTER — Ambulatory Visit (INDEPENDENT_AMBULATORY_CARE_PROVIDER_SITE_OTHER): Payer: BLUE CROSS/BLUE SHIELD | Admitting: Obstetrics and Gynecology

## 2016-05-30 VITALS — BP 112/70 | HR 84 | Resp 16 | Wt 161.0 lb

## 2016-05-30 DIAGNOSIS — N764 Abscess of vulva: Secondary | ICD-10-CM

## 2016-05-30 NOTE — Progress Notes (Signed)
GYNECOLOGY  VISIT   HPI: 20 y.o.   Single  Caucasian  female   G0P0 with Patient's last menstrual period was 05/27/2016.   here for follow up vulvar abscess    Had I and D of abscess and took one week of Augmentin.  Feeling much better overall.  Wound cx - no dominant organisms.   Broke her toe last night? Left foot is really sore.  Taped her toes together.   GYNECOLOGIC HISTORY: Patient's last menstrual period was 05/27/2016. Contraception:  OCP Menopausal hormone therapy:  N/A Last mammogram:  N/A Last pap smear:  Never        OB History    Gravida Para Term Preterm AB TAB SAB Ectopic Multiple Living   0                  Patient Active Problem List   Diagnosis Date Noted  . Depression with suicidal ideation 10/27/2013    Past Medical History  Diagnosis Date  . GERD (gastroesophageal reflux disease)   . Anxiety   . Depression   . Migraine     Past Surgical History  Procedure Laterality Date  . Wisdom teeth removal    . Eustacian tubes as infant    . Adenoidectomy  1998  . Labioplasty Right 04/29/2013    Procedure: right labia reduction;  Surgeon: Melony Overly, MD;  Location: [facility];  Service: Gynecology;  Laterality: Right;    Current Outpatient Prescriptions  Medication Sig Dispense Refill  . levonorgestrel-ethinyl estradiol (AVIANE,ALESSE,LESSINA) 0.1-20 MG-MCG tablet Take 1 tablet by mouth daily. 3 Package 3  . omeprazole (PRILOSEC) 10 MG capsule Take 1 capsule (10 mg total) by mouth daily.     No current facility-administered medications for this visit.     ALLERGIES: Review of patient's allergies indicates no known allergies.  Family History  Problem Relation Age of Onset  . Osteoarthritis Mother   . Migraines Mother   . Diabetes Mother   . Osteoarthritis Father   . Osteoarthritis Maternal Grandfather   . Hypertension Maternal Grandfather   . Hyperlipidemia Maternal Grandfather   . Stroke Maternal Grandfather   . Thyroid disease Maternal  Grandfather   . Throat cancer Maternal Grandfather   . Osteoarthritis Paternal Grandmother   . Osteoarthritis Paternal Grandfather     Social History   Social History  . Marital Status: Single    Spouse Name: N/A  . Number of Children: N/A  . Years of Education: N/A   Occupational History  . Not on file.   Social History Main Topics  . Smoking status: Never Smoker   . Smokeless tobacco: Never Used  . Alcohol Use: No  . Drug Use: No  . Sexual Activity: No     Comment: Garnette Scheuermann   Other Topics Concern  . Not on file   Social History Narrative    ROS:  Pertinent items are noted in HPI.  PHYSICAL EXAMINATION:    BP 112/70 mmHg  Pulse 84  Resp 16  Wt 161 lb (73.029 kg)  LMP 05/27/2016    General appearance: alert, cooperative and appears stated age   Pelvic: External genitalia:   Left labia majora with closed incision.  Mild induration.  No erythema.  No drainage.  Nontender.         Chaperone was present for exam.  ASSESSMENT  Vulvar abscess treated with I and D and Augmentin.  Resolved. Foot injury.   PLAN  Call for recurrence of abscess.  Signs and sx discussed.  Follow up for routine GYN exam next year and pap. I recommend patient go to urgent care for evaluation of foot injury.   An After Visit Summary was printed and given to the patient.  _10_____ minutes face to face time of which over 50% was spent in counseling.

## 2016-11-21 ENCOUNTER — Encounter: Payer: Self-pay | Admitting: Obstetrics and Gynecology

## 2016-12-29 ENCOUNTER — Other Ambulatory Visit: Payer: Self-pay | Admitting: Obstetrics and Gynecology

## 2016-12-31 NOTE — Telephone Encounter (Signed)
Medication refill request: OCP  Last AEX:  04-13-16  Next AEX: 04-25-17 Last MMG (if hormonal medication request): N/A Refill authorized: please advise

## 2017-04-24 NOTE — Progress Notes (Addendum)
21 y.o. G0P0 Single Caucasian female here for annual exam.    Feeling stressed and gaining weight.  Hair falling out in general.  Headaches - two per week.  One "migraine" per month.  Has phobophobia and sensitivity to sound when this occurs.  No aura.  Had these headaches as a child.   Has done counseling in the past for a different issue. She will call to schedule an appointment.   Graduated from college.  Thinking about going to grad school.  Working at an [employer], counselling also.  Thinks her stress will decrease after she determines where she will go to grad school.  No new partners.  Declines STD testing.   Menses longer during her time of final exams.  Lasted one week instead of 4 days and was heavier.  Pad change every 4 hours. Last menses was normal.   Happy with her OCPs.  No missed pills.   Traveling to [place] to see her best friend!  PCP: No PCP per patient    Patient's last menstrual period was 04/10/2017.           Sexually active: Yes.    The current method of family planning is OCP (estrogen/progesterone).    Exercising: Yes.    horseback riding daily, workout once a week Smoker:  no  Health Maintenance: Pap:  never History of abnormal Pap:  n/a TDaP:  01/2015 Gardasil:   Yes, 2014 HIV: 04/13/16 Negative Hep C: 04/13/15 Negative Screening Labs:  Hb today: discuss today   reports that she has never smoked. She has never used smokeless tobacco. She reports that she does not drink alcohol or use drugs.  Past Medical History:  Diagnosis Date  . Anxiety   . Depression   . GERD (gastroesophageal reflux disease)   . Migraine     Past Surgical History:  Procedure Laterality Date  . ADENOIDECTOMY  1998  . eustacian tubes as infant    . LABIOPLASTY Right 04/29/2013   Procedure: right labia reduction;  Surgeon: Melony Overly, MD;  Location: [facility];  Service: Gynecology;  Laterality: Right;  . wisdom teeth removal      Current Outpatient Prescriptions   Medication Sig Dispense Refill  . omeprazole (PRILOSEC) 10 MG capsule Take 1 capsule (10 mg total) by mouth daily.    Marland Kitchen VIENVA 0.1-20 MG-MCG tablet TAKE ONE TABLET BY MOUTH DAILY 84 tablet 1   No current facility-administered medications for this visit.     Family History  Problem Relation Age of Onset  . Osteoarthritis Mother   . Migraines Mother   . Diabetes Mother   . Osteoarthritis Father   . Osteoarthritis Maternal Grandfather   . Hypertension Maternal Grandfather   . Hyperlipidemia Maternal Grandfather   . Stroke Maternal Grandfather   . Thyroid disease Maternal Grandfather   . Throat cancer Maternal Grandfather   . Osteoarthritis Paternal Grandmother   . Osteoarthritis Paternal Grandfather     ROS:  Pertinent items are noted in HPI.  Otherwise, a comprehensive ROS was negative.  Exam:   BP 108/60 (BP Location: Right Arm, Patient Position: Sitting, Cuff Size: Normal)   Pulse 88   Resp 16   Ht 5\' 6"  (1.676 m)   Wt 167 lb (75.8 kg)   LMP 04/10/2017   BMI 26.95 kg/m     General appearance: alert, cooperative and appears stated age Head: Normocephalic, without obvious abnormality, atraumatic Neck: no adenopathy, supple, symmetrical, trachea midline and thyroid normal to inspection and  palpation Lungs: clear to auscultation bilaterally Breasts: normal appearance, no masses or tenderness, No nipple retraction or dimpling, No nipple discharge or bleeding, No axillary or supraclavicular adenopathy Heart: regular rate and rhythm Abdomen: soft, non-tender; no masses, no organomegaly Extremities: extremities normal, atraumatic, no cyanosis or edema Skin: Skin color, texture, turgor normal. No rashes or lesions Lymph nodes: Cervical, supraclavicular, and axillary nodes normal. No abnormal inguinal nodes palpated Neurologic: Grossly normal  Pelvic: External genitalia:  no lesions              Urethra:  normal appearing urethra with no masses, tenderness or lesions               Bartholins and Skenes: normal                 Vagina: normal appearing vagina with normal color and discharge, no lesions              Cervix: no lesions              Pap taken: Yes.   Bimanual Exam:  Uterus:  normal size, contour, position, consistency, mobility, non-tender              Adnexa: no mass, fullness, tenderness             Chaperone was present for exam.  Assessment:   Well woman visit with normal exam. Alopecia. Situational stress.   Plan: Mammogram screening discussed. Recommended self breast awareness. Pap and HR HPV as above. Guidelines for Calcium, Vitamin D, regular exercise program including cardiovascular and weight bearing exercise. Routine labs and TSH, testosterone.  Refill OCPs for one year.  She will contact her counselor about her stress.  Follow up annually and prn.    After visit summary provided.

## 2017-04-25 ENCOUNTER — Ambulatory Visit (INDEPENDENT_AMBULATORY_CARE_PROVIDER_SITE_OTHER): Payer: BLUE CROSS/BLUE SHIELD | Admitting: Obstetrics and Gynecology

## 2017-04-25 ENCOUNTER — Other Ambulatory Visit (HOSPITAL_COMMUNITY)
Admission: RE | Admit: 2017-04-25 | Discharge: 2017-04-25 | Disposition: A | Discharge status: Home / Self Care | Payer: BLUE CROSS/BLUE SHIELD | Source: Ambulatory Visit | Attending: Obstetrics and Gynecology | Admitting: Obstetrics and Gynecology

## 2017-04-25 ENCOUNTER — Encounter: Payer: Self-pay | Admitting: Obstetrics and Gynecology

## 2017-04-25 VITALS — BP 108/60 | HR 88 | Resp 16 | Ht 66.0 in | Wt 167.0 lb

## 2017-04-25 DIAGNOSIS — Z01419 Encounter for gynecological examination (general) (routine) without abnormal findings: Secondary | ICD-10-CM

## 2017-04-25 DIAGNOSIS — L659 Nonscarring hair loss, unspecified: Secondary | ICD-10-CM | POA: Diagnosis not present

## 2017-04-25 MED ORDER — LEVONORGESTREL-ETHINYL ESTRAD 0.1-20 MG-MCG PO TABS: 1.0000 | ORAL_TABLET | Freq: Every day | ORAL | 3 refills | Status: DC

## 2017-04-25 NOTE — Patient Instructions (Signed)

## 2017-04-26 LAB — LIPID PANEL
Chol/HDL Ratio: 2.7 ratio (ref 0.0–4.4)
Cholesterol, Total: 140 mg/dL (ref 100–199)
HDL: 51 mg/dL (ref >39)
LDL CALC: 71 mg/dL (ref 0–99)
Triglycerides: 89 mg/dL (ref 0–149)
VLDL CHOLESTEROL CAL: 18 mg/dL (ref 5–40)

## 2017-04-26 LAB — COMPREHENSIVE METABOLIC PANEL
ALT: 8 IU/L (ref 0–32)
AST: 14 IU/L (ref 0–40)
Albumin/Globulin Ratio: 1.5 (ref 1.2–2.2)
Albumin: 4.1 g/dL (ref 3.5–5.5)
Alkaline Phosphatase: 85 IU/L (ref 39–117)
BUN/Creatinine Ratio: 21 (ref 9–23)
BUN: 12 mg/dL (ref 6–20)
Bilirubin Total: 0.3 mg/dL (ref 0.0–1.2)
CO2: 24 mmol/L (ref 20–29)
Calcium: 9.1 mg/dL (ref 8.7–10.2)
Chloride: 103 mmol/L (ref 96–106)
Creatinine, Ser: 0.57 mg/dL (ref 0.57–1.00)
GFR calc Af Amer: 153 mL/min/{1.73_m2} (ref >59)
GFR, EST NON AFRICAN AMERICAN: 133 mL/min/{1.73_m2} (ref >59)
GLOBULIN, TOTAL: 2.8 g/dL (ref 1.5–4.5)
Glucose: 85 mg/dL (ref 65–99)
POTASSIUM: 4.4 mmol/L (ref 3.5–5.2)
SODIUM: 141 mmol/L (ref 134–144)
Total Protein: 6.9 g/dL (ref 6.0–8.5)

## 2017-04-26 LAB — TESTOSTERONE: Testosterone: 22 ng/dL (ref 8–48)

## 2017-04-26 LAB — CBC
HEMOGLOBIN: 11.8 g/dL (ref 11.1–15.9)
Hematocrit: 36.7 % (ref 34.0–46.6)
MCH: 27.9 pg (ref 26.6–33.0)
MCHC: 32.2 g/dL (ref 31.5–35.7)
MCV: 87 fL (ref 79–97)
Platelets: 321 10*3/uL (ref 150–379)
RBC: 4.23 x10E6/uL (ref 3.77–5.28)
RDW: 13.4 % (ref 12.3–15.4)
WBC: 6.3 10*3/uL (ref 3.4–10.8)

## 2017-04-26 LAB — TSH: TSH: 2.41 u[IU]/mL (ref 0.450–4.500)

## 2017-04-29 LAB — CYTOLOGY - PAP: Diagnosis: NEGATIVE

## 2018-01-09 DIAGNOSIS — J069 Acute upper respiratory infection, unspecified: Secondary | ICD-10-CM | POA: Diagnosis not present

## 2018-01-09 DIAGNOSIS — R6889 Other general symptoms and signs: Secondary | ICD-10-CM | POA: Diagnosis not present

## 2018-01-09 DIAGNOSIS — J020 Streptococcal pharyngitis: Secondary | ICD-10-CM | POA: Diagnosis not present

## 2018-05-01 ENCOUNTER — Other Ambulatory Visit: Payer: Self-pay

## 2018-05-01 ENCOUNTER — Encounter: Payer: Self-pay | Admitting: Obstetrics and Gynecology

## 2018-05-01 ENCOUNTER — Ambulatory Visit (INDEPENDENT_AMBULATORY_CARE_PROVIDER_SITE_OTHER): Payer: BLUE CROSS/BLUE SHIELD | Admitting: Obstetrics and Gynecology

## 2018-05-01 VITALS — BP 104/64 | HR 68 | Resp 16 | Ht 64.75 in | Wt 167.0 lb

## 2018-05-01 DIAGNOSIS — E010 Iodine-deficiency related diffuse (endemic) goiter: Secondary | ICD-10-CM | POA: Diagnosis not present

## 2018-05-01 DIAGNOSIS — Z01419 Encounter for gynecological examination (general) (routine) without abnormal findings: Secondary | ICD-10-CM | POA: Diagnosis not present

## 2018-05-01 MED ORDER — LEVONORGESTREL-ETHINYL ESTRAD 0.1-20 MG-MCG PO TABS: 1.0000 | ORAL_TABLET | Freq: Every day | ORAL | 3 refills | Status: DC

## 2018-05-01 NOTE — Progress Notes (Signed)
Patient scheduled while in office for [facility] on 05/09/18 at 11:15am, arriving at 10:55am. [address] location. Patient verbalizes understanding and is agreeable.

## 2018-05-01 NOTE — Progress Notes (Signed)
22 y.o. G59P0 Single Caucasian female here for annual exam.    Menses are longer now, lasting 1 week.  Can be lighter or heavier.  Not sure if it is due to stress.  No missed or late pills.  Wants to continue her current pill.   Declines STD testing.  SA once in the last year and used protection.  This was a prior partner.   Feels like her neck is getting bigger and fuller.  Gaining weight.  Thyroid US normal in 2014.   Searching for a job. Considering graduate school.  PCP:   [facility]  Patient's last menstrual period was 04/16/2018 (exact date).           Sexually active: Yes.    The current method of family planning is OCP (estrogen/progesterone).    Exercising: No.  exercise Smoker:  no  Health Maintenance: Pap:  04/25/17 Negative History of abnormal Pap:  no MMG:  none Colonoscopy:  none BMD:   none  TDaP:  Within 75yrs Gardasil:   yes HIV: 2017 neg Hep C: 2016 neg Screening Labs:     reports that she has never smoked. She has never used smokeless tobacco. She reports that she drinks about 1.2 oz of alcohol per week. She reports that she does not use drugs.  Past Medical History:  Diagnosis Date  . Anxiety   . Depression   . GERD (gastroesophageal reflux disease)   . Migraine     Past Surgical History:  Procedure Laterality Date  . ADENOIDECTOMY  1998  . eustacian tubes as infant    . LABIOPLASTY Right 04/29/2013   Procedure: right labia reduction;  Surgeon: Melony Overly, MD;  Location: [facility];  Service: Gynecology;  Laterality: Right;  . wisdom teeth removal      Current Outpatient Medications  Medication Sig Dispense Refill  . levonorgestrel-ethinyl estradiol (VIENVA) 0.1-20 MG-MCG tablet Take 1 tablet by mouth daily. 84 tablet 3  . omeprazole (PRILOSEC) 10 MG capsule Take 1 capsule (10 mg total) by mouth daily.     No current facility-administered medications for this visit.     Family History  Problem Relation Age  of Onset  . Osteoarthritis Mother   . Migraines Mother   . Diabetes Mother   . Osteoarthritis Father   . Osteoarthritis Maternal Grandfather   . Hypertension Maternal Grandfather   . Hyperlipidemia Maternal Grandfather   . Stroke Maternal Grandfather   . Thyroid disease Maternal Grandfather   . Throat cancer Maternal Grandfather   . Osteoarthritis Paternal Grandmother   . Osteoarthritis Paternal Grandfather     Review of Systems  Constitutional: Negative.   HENT: Negative.   Eyes: Negative.   Respiratory: Negative.   Cardiovascular: Negative.   Gastrointestinal: Negative.   Endocrine: Negative.   Genitourinary: Negative.   Musculoskeletal: Negative.   Skin: Negative.   Allergic/Immunologic: Negative.   Neurological: Negative.   Hematological: Negative.   Psychiatric/Behavioral:       Depression    Exam:   BP 104/64   Pulse 68   Resp 16   Ht 5' 4.75" (1.645 m)   Wt 167 lb (75.8 kg)   LMP 04/16/2018 (Exact Date)   BMI 28.01 kg/m     General appearance: alert, cooperative and appears stated age Head: Normocephalic, without obvious abnormality, atraumatic Neck: no adenopathy, supple, symmetrical, trachea midline and thyroid normal to inspection and palpation Lungs: clear to auscultation bilaterally Breasts: normal appearance, no masses  or tenderness, No nipple retraction or dimpling, No nipple discharge or bleeding, No axillary or supraclavicular adenopathy Heart: regular rate and rhythm Abdomen: soft, non-tender; no masses, no organomegaly Extremities: extremities normal, atraumatic, no cyanosis or edema Skin: Skin color, texture, turgor normal. No rashes or lesions Lymph nodes: Cervical, supraclavicular, and axillary nodes normal. No abnormal inguinal nodes palpated Neurologic: Grossly normal  Pelvic: External genitalia:  no lesions              Urethra:  normal appearing urethra with no masses, tenderness or lesions              Bartholins and Skenes: normal                  Vagina: normal appearing vagina with normal color and discharge, no lesions              Cervix: no lesions              Pap taken: No. Bimanual Exam:  Uterus:  normal size, contour, position, consistency, mobility, non-tender              Adnexa: no mass, fullness, tenderness             Chaperone was present for exam.  Assessment:   Well woman visit with normal exam. Thyromegaly.   Plan: Mammogram screening. Recommended self breast awareness. Pap and HR HPV as above. Guidelines for Calcium, Vitamin D, regular exercise program including cardiovascular and weight bearing exercise. TFTs and thyroid ultrasound.  Refill of OCPs for one year. Follow up annually and prn.   After visit summary provided.

## 2018-05-01 NOTE — Patient Instructions (Signed)

## 2018-05-02 LAB — THYROID PANEL WITH TSH
Free Thyroxine Index: 1.6 (ref 1.2–4.9)
T3 UPTAKE RATIO: 18 % — AB (ref 24–39)
T4, Total: 8.7 ug/dL (ref 4.5–12.0)
TSH: 2.62 u[IU]/mL (ref 0.450–4.500)

## 2018-05-09 ENCOUNTER — Ambulatory Visit
Admission: RE | Admit: 2018-05-09 | Discharge: 2018-05-09 | Disposition: A | Discharge status: Home / Self Care | Payer: BLUE CROSS/BLUE SHIELD | Source: Ambulatory Visit | Attending: Obstetrics and Gynecology | Admitting: Obstetrics and Gynecology

## 2018-05-09 DIAGNOSIS — E010 Iodine-deficiency related diffuse (endemic) goiter: Secondary | ICD-10-CM | POA: Diagnosis not present

## 2018-06-06 DIAGNOSIS — F320 Major depressive disorder, single episode, mild: Secondary | ICD-10-CM | POA: Diagnosis not present

## 2018-07-18 DIAGNOSIS — F320 Major depressive disorder, single episode, mild: Secondary | ICD-10-CM | POA: Diagnosis not present

## 2018-07-29 DIAGNOSIS — J020 Streptococcal pharyngitis: Secondary | ICD-10-CM | POA: Diagnosis not present

## 2018-09-19 ENCOUNTER — Telehealth: Payer: Self-pay | Admitting: Obstetrics and Gynecology

## 2018-09-19 NOTE — Telephone Encounter (Signed)
Left message on voicemail to call and reschedule cancelled appointment. °

## 2018-10-17 DIAGNOSIS — F320 Major depressive disorder, single episode, mild: Secondary | ICD-10-CM | POA: Diagnosis not present

## 2019-01-21 ENCOUNTER — Other Ambulatory Visit: Payer: Self-pay | Admitting: Obstetrics and Gynecology

## 2019-01-21 NOTE — Telephone Encounter (Signed)
Medication refill request: Vienva  Last AEX:  05/01/18 Next AEX: 05/02/19 Last MMG (if hormonal medication request): NA Refill authorized: #[id number] with 0 rf

## 2019-04-08 ENCOUNTER — Other Ambulatory Visit: Payer: Self-pay

## 2019-04-09 ENCOUNTER — Other Ambulatory Visit (HOSPITAL_COMMUNITY)
Admission: RE | Admit: 2019-04-09 | Discharge: 2019-04-09 | Disposition: A | Discharge status: Home / Self Care | Payer: BLUE CROSS/BLUE SHIELD | Source: Ambulatory Visit | Attending: Obstetrics and Gynecology | Admitting: Obstetrics and Gynecology

## 2019-04-09 ENCOUNTER — Encounter: Payer: Self-pay | Admitting: Obstetrics and Gynecology

## 2019-04-09 ENCOUNTER — Ambulatory Visit: Payer: BLUE CROSS/BLUE SHIELD | Admitting: Obstetrics and Gynecology

## 2019-04-09 VITALS — BP 110/60 | HR 68 | Temp 98.0°F | Resp 12 | Ht 64.75 in | Wt 166.0 lb

## 2019-04-09 DIAGNOSIS — Z113 Encounter for screening for infections with a predominantly sexual mode of transmission: Secondary | ICD-10-CM | POA: Diagnosis not present

## 2019-04-09 NOTE — Progress Notes (Signed)
GYNECOLOGY  VISIT   HPI: 23 y.o.   Single  Caucasian  female   G0P0 with Patient's last menstrual period was 03/19/2019.   here for std testing   Had unprotected sex.  No abnormal bleeding, pelvic pain, or fever.   No missed pills.   Has a new business pet sitting.   GYNECOLOGIC HISTORY: Patient's last menstrual period was 03/19/2019. Contraception:  OCP Menopausal hormone therapy:  n/a Last mammogram:  n/a Last pap smear:   04/25/17 Negative        OB History    Gravida  0   Para      Term      Preterm      AB      Living        SAB      TAB      Ectopic      Multiple      Live Births                 Patient Active Problem List   Diagnosis Date Noted  . Depression with suicidal ideation 10/27/2013    Past Medical History:  Diagnosis Date  . Anxiety   . Depression   . GERD (gastroesophageal reflux disease)   . Migraine     Past Surgical History:  Procedure Laterality Date  . ADENOIDECTOMY  1998  . eustacian tubes as infant    . LABIOPLASTY Right 04/29/2013   Procedure: right labia reduction;  Surgeon: Melony Overly, MD;  Location: [facility];  Service: Gynecology;  Laterality: Right;  . wisdom teeth removal      Current Outpatient Medications  Medication Sig Dispense Refill  . escitalopram (LEXAPRO) 10 MG tablet Take 10 mg by mouth daily.    Marland Kitchen omeprazole (PRILOSEC) 10 MG capsule Take 1 capsule (10 mg total) by mouth daily.    Marland Kitchen VIENVA 0.1-20 MG-MCG tablet TAKE ONE TABLET BY MOUTH DAILY 84 tablet 0   No current facility-administered medications for this visit.      ALLERGIES: Patient has no known allergies.  Family History  Problem Relation Age of Onset  . Osteoarthritis Mother   . Migraines Mother   . Diabetes Mother   . Osteoarthritis Father   . Osteoarthritis Maternal Grandfather   . Hypertension Maternal Grandfather   . Hyperlipidemia Maternal Grandfather   . Stroke Maternal Grandfather   . Thyroid disease Maternal Grandfather    . Throat cancer Maternal Grandfather   . Osteoarthritis Paternal Grandmother   . Osteoarthritis Paternal Grandfather     Social History   Socioeconomic History  . Marital status: Single    Spouse name: Not on file  . Number of children: Not on file  . Years of education: Not on file  . Highest education level: Not on file  Occupational History  . Not on file  Social Needs  . Financial resource strain: Not on file  . Food insecurity:    Worry: Not on file    Inability: Not on file  . Transportation needs:    Medical: Not on file    Non-medical: Not on file  Tobacco Use  . Smoking status: Never Smoker  . Smokeless tobacco: Never Used  Substance and Sexual Activity  . Alcohol use: Yes    Alcohol/week: 2.0 standard drinks    Types: 2 Standard drinks or equivalent per week  . Drug use: No  . Sexual activity: Yes    Partners: Male    Birth  control/protection: Pill  Lifestyle  . Physical activity:    Days per week: Not on file    Minutes per session: Not on file  . Stress: Not on file  Relationships  . Social connections:    Talks on phone: Not on file    Gets together: Not on file    Attends religious service: Not on file    Active member of club or organization: Not on file    Attends meetings of clubs or organizations: Not on file    Relationship status: Not on file  . Intimate partner violence:    Fear of current or ex partner: Not on file    Emotionally abused: Not on file    Physically abused: Not on file    Forced sexual activity: Not on file  Other Topics Concern  . Not on file  Social History Narrative  . Not on file    Review of Systems  Constitutional: Negative.   HENT: Negative.   Eyes: Negative.   Respiratory: Negative.   Cardiovascular: Negative.   Gastrointestinal: Negative.   Endocrine: Negative.   Genitourinary: Negative.   Musculoskeletal: Negative.   Skin: Negative.   Allergic/Immunologic: Negative.   Neurological: Negative.    Hematological: Negative.   Psychiatric/Behavioral: Negative.     PHYSICAL EXAMINATION:    BP 110/60 (BP Location: Right Arm, Patient Position: Sitting, Cuff Size: Normal)   Pulse 68   Temp 98 F (36.7 C) (Temporal)   Resp 12   Ht 5' 4.75" (1.645 m)   Wt 166 lb (75.3 kg)   LMP 03/19/2019   BMI 27.84 kg/m     General appearance: alert, cooperative and appears stated age   Pelvic: External genitalia:  no lesions              Urethra:  normal appearing urethra with no masses, tenderness or lesions              Bartholins and Skenes: normal                 Vagina: normal appearing vagina with normal color and discharge, no lesions              Cervix: no lesions.  Bleeds slightly.                 Bimanual Exam:  Uterus:  normal size, contour, position, consistency, mobility, non-tender              Adnexa: no mass, fullness, tenderness         Chaperone was present for exam.  ASSESSMENT  Desire for STD screening.   PLAN  GC/CT/trichomonas testing from thin Prep. HIV, RPR, HBsAg, hep C aby.  Condoms discussed.  FU for annual exam and prn.    An After Visit Summary was printed and given to the patient.  _15____ minutes face to face time of which over 50% was spent in counseling.

## 2019-04-10 LAB — HEPATITIS C ANTIBODY: Hep C Virus Ab: <0.1 s/co ratio (ref 0.0–0.9)

## 2019-04-10 LAB — CERVICOVAGINAL ANCILLARY ONLY
Chlamydia: NEGATIVE
Neisseria Gonorrhea: NEGATIVE
Trichomonas: NEGATIVE

## 2019-04-10 LAB — HEP, RPR, HIV PANEL
HIV Screen 4th Generation wRfx: NONREACTIVE
Hepatitis B Surface Ag: NEGATIVE
RPR Ser Ql: NONREACTIVE

## 2019-04-17 DIAGNOSIS — F320 Major depressive disorder, single episode, mild: Secondary | ICD-10-CM | POA: Diagnosis not present

## 2019-05-07 ENCOUNTER — Other Ambulatory Visit: Payer: Self-pay

## 2019-05-11 ENCOUNTER — Other Ambulatory Visit: Payer: Self-pay

## 2019-05-11 ENCOUNTER — Ambulatory Visit: Payer: BC Managed Care – PPO | Admitting: Obstetrics and Gynecology

## 2019-05-11 ENCOUNTER — Encounter: Payer: Self-pay | Admitting: Obstetrics and Gynecology

## 2019-05-11 VITALS — BP 108/60 | HR 80 | Temp 98.1°F | Resp 14 | Ht 65.0 in | Wt 164.0 lb

## 2019-05-11 DIAGNOSIS — Z01419 Encounter for gynecological examination (general) (routine) without abnormal findings: Secondary | ICD-10-CM

## 2019-05-11 MED ORDER — LEVONORGESTREL-ETHINYL ESTRAD 0.1-20 MG-MCG PO TABS: 1.0000 | ORAL_TABLET | Freq: Every day | ORAL | 3 refills | Status: DC

## 2019-05-11 NOTE — Patient Instructions (Signed)

## 2019-05-11 NOTE — Progress Notes (Signed)
23 y.o. G0P0 Single Caucasian female here for annual exam.    No changes in health.   Happy with her OCPs.  Less cramping and bleeding with her pills.  Remembering her pills.   Had negative STD testing last month.  No new partner.   She will do labs with her PCP.   PCP:  Marinda Elkobert Fried, MD  Patient's last menstrual period was 04/13/2019 (within days).           Sexually active: Yes.    The current method of family planning is OCP (estrogen/progesterone).   Condoms also.  Exercising: Yes.    walks the dog and rides horses Smoker:  no  Health Maintenance: Pap:  04/25/17 Negative History of abnormal Pap:  no TDaP:  2014 Gardasil:   yes HIV and Hep C: 04/09/19 Negative Screening Labs:  none   reports that she has never smoked. She has never used smokeless tobacco. She reports current alcohol use of about 2.0 standard drinks of alcohol per week. She reports that she does not use drugs.  Past Medical History:  Diagnosis Date  . Anxiety   . Depression   . GERD (gastroesophageal reflux disease)   . Migraine    no aura.     Past Surgical History:  Procedure Laterality Date  . ADENOIDECTOMY  1998  . eustacian tubes as infant    . LABIOPLASTY Right 04/29/2013   Procedure: right labia reduction;  Surgeon: Melony OverlyBrook A Silva, MD;  Location: [facility];  Service: Gynecology;  Laterality: Right;  . wisdom teeth removal      Current Outpatient Medications  Medication Sig Dispense Refill  . escitalopram (LEXAPRO) 10 MG tablet Take 10 mg by mouth daily.    Marland Kitchen. omeprazole (PRILOSEC) 10 MG capsule Take 1 capsule (10 mg total) by mouth daily.    Marland Kitchen. VIENVA 0.1-20 MG-MCG tablet TAKE ONE TABLET BY MOUTH DAILY 84 tablet 0   No current facility-administered medications for this visit.     Family History  Problem Relation Age of Onset  . Osteoarthritis Mother   . Migraines Mother   . Diabetes Mother   . Osteoarthritis Father   . Osteoarthritis Maternal Grandfather   . Hypertension Maternal  Grandfather   . Hyperlipidemia Maternal Grandfather   . Stroke Maternal Grandfather   . Thyroid disease Maternal Grandfather   . Throat cancer Maternal Grandfather   . Osteoarthritis Paternal Grandmother   . Osteoarthritis Paternal Grandfather     Review of Systems  Constitutional: Negative.   HENT: Negative.   Eyes: Negative.   Respiratory: Negative.   Cardiovascular: Negative.   Gastrointestinal: Negative.   Endocrine: Negative.   Genitourinary: Negative.   Musculoskeletal: Negative.   Skin: Negative.   Allergic/Immunologic: Negative.   Neurological: Negative.   Hematological: Negative.   Psychiatric/Behavioral: Negative.     Exam:   BP 108/60 (BP Location: Right Arm, Patient Position: Sitting, Cuff Size: Normal)   Pulse 80   Temp 98.1 F (36.7 C) (Temporal)   Resp 14   Ht 5\' 5"  (1.651 m)   Wt 164 lb (74.4 kg)   LMP 04/13/2019 (Within Days)   BMI 27.29 kg/m     General appearance: alert, cooperative and appears stated age Head: normocephalic, without obvious abnormality, atraumatic Neck: no adenopathy, supple, symmetrical, trachea midline and thyroid normal to inspection and palpation Lungs: clear to auscultation bilaterally Breasts: normal appearance, no masses or tenderness, No nipple retraction or dimpling, No nipple discharge or bleeding, No axillary adenopathy Heart:  regular rate and rhythm Abdomen: soft, non-tender; no masses, no organomegaly Extremities: extremities normal, atraumatic, no cyanosis or edema Skin: skin color, texture, turgor normal. No rashes or lesions Lymph nodes: cervical, supraclavicular, and axillary nodes normal. Neurologic: grossly normal  Pelvic: External genitalia:  no lesions              No abnormal inguinal nodes palpated.              Urethra:  normal appearing urethra with no masses, tenderness or lesions              Bartholins and Skenes: normal                 Vagina: normal appearing vagina with normal color and discharge,  no lesions              Cervix: no lesions              Pap taken: No. Bimanual Exam:  Uterus:  normal size, contour, position, consistency, mobility, non-tender              Adnexa: no mass, fullness, tenderness         Chaperone was present for exam.  Assessment:   Well woman visit with normal exam. Thyromegaly.  Normal thyroid US.  Normal TSH and T4.  Migraine without aura.   Plan: Mammogram screening discussed. Self breast awareness reviewed. Pap and HR HPV as above. Guidelines for Calcium, Vitamin D, regular exercise program including cardiovascular and weight bearing exercise. Refill of OCPs for one year. Follow up annually and prn.   After visit summary provided.

## 2019-05-28 ENCOUNTER — Ambulatory Visit: Payer: BLUE CROSS/BLUE SHIELD | Admitting: Obstetrics and Gynecology

## 2019-06-11 IMAGING — US US THYROID
1 series · 14 of 25 positions shown · non-contrast
Comparison: Prior thyroid ultrasound 04/10/2013

CLINICAL DATA: Palpable abnormality. 21-year-old female with
thyromegaly

EXAM:
THYROID ULTRASOUND
TECHNIQUE: Ultrasound examination of the thyroid gland and adjacent soft
tissues was performed.

[Series 1: us thyroid · 0.04mm/px · 14 of 35 slices shown]
[im 1/35]
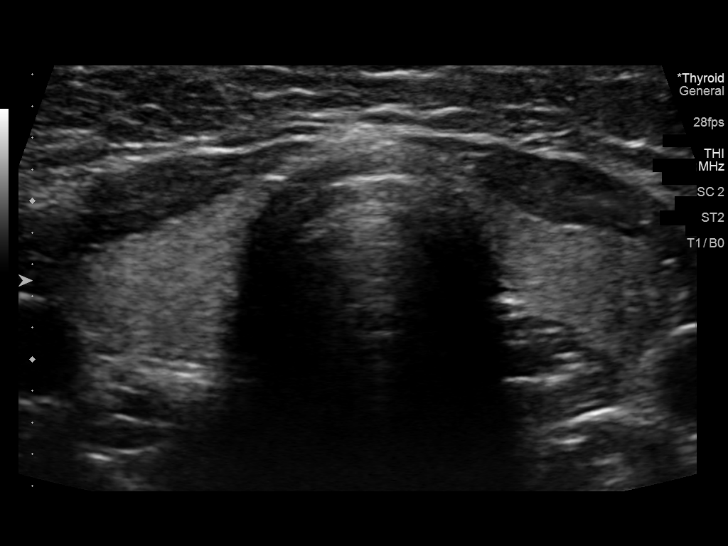
[im 3/35]
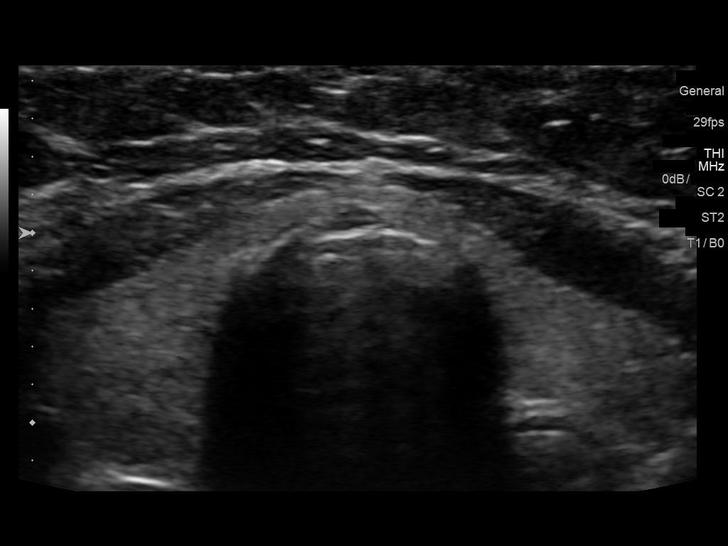
[im 6/35]
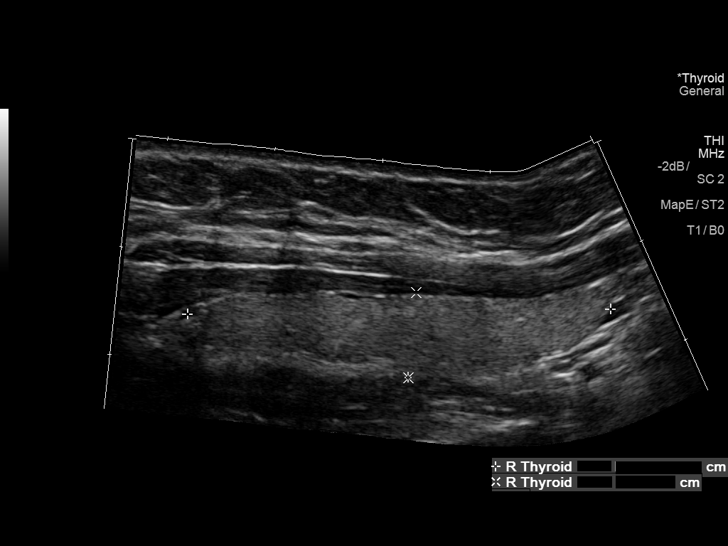
[im 9/35]
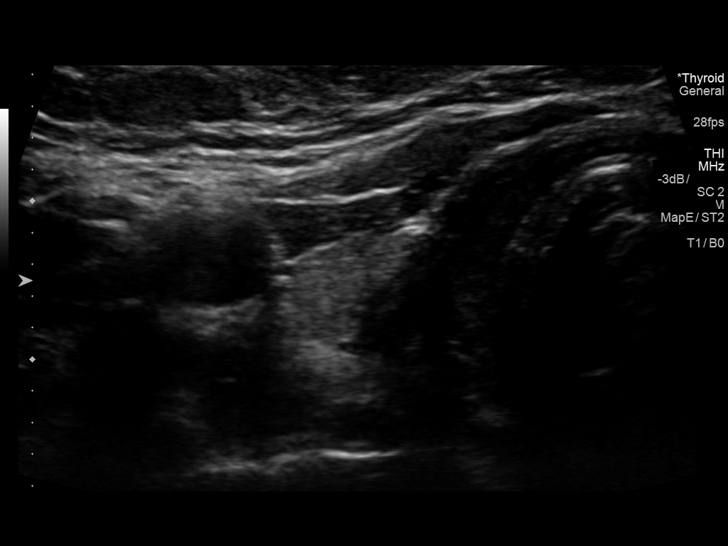
[im 12/35]
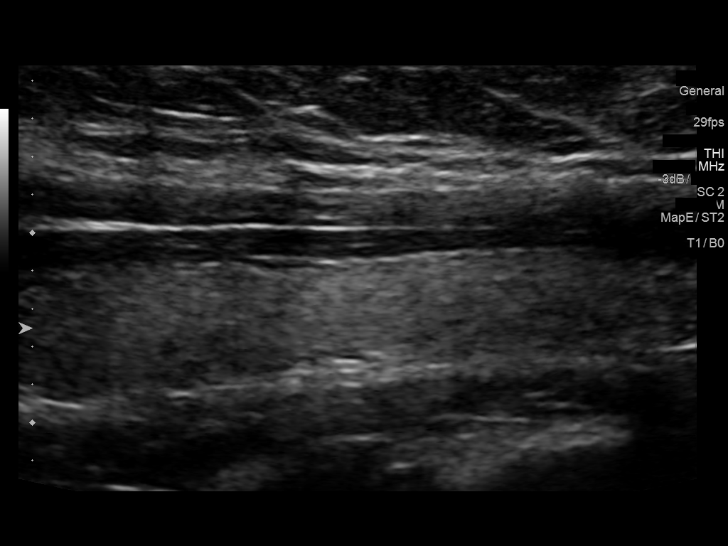
[im 13/35]
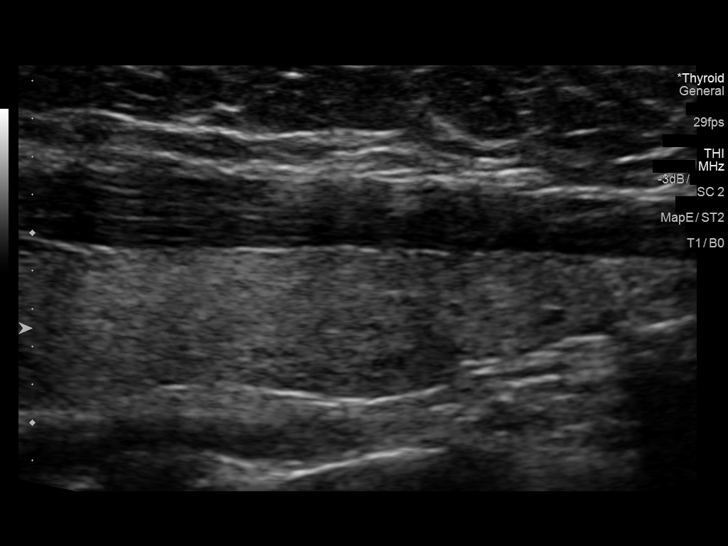
[im 16/35]
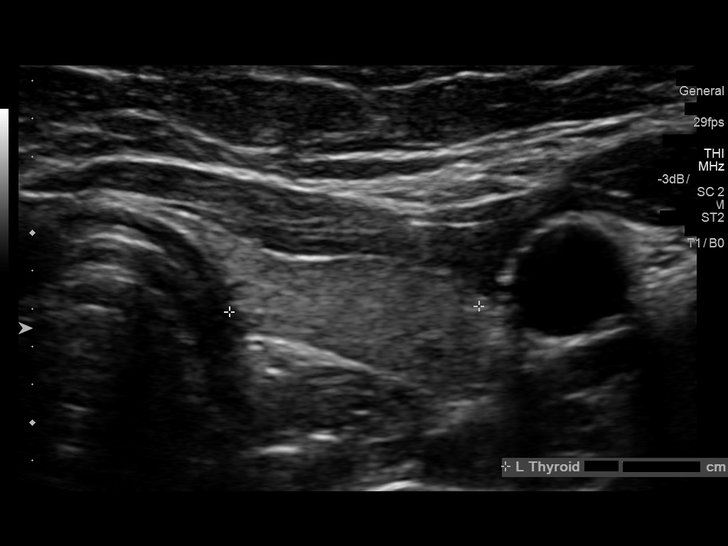
[im 19/35]
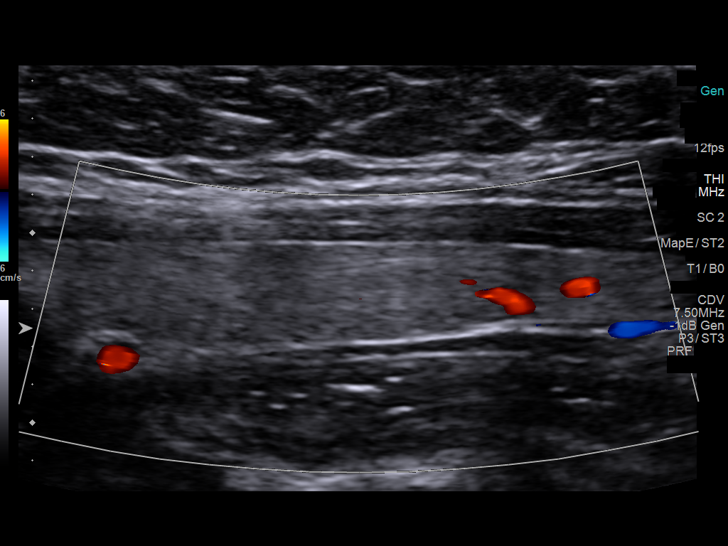
[im 22/35]
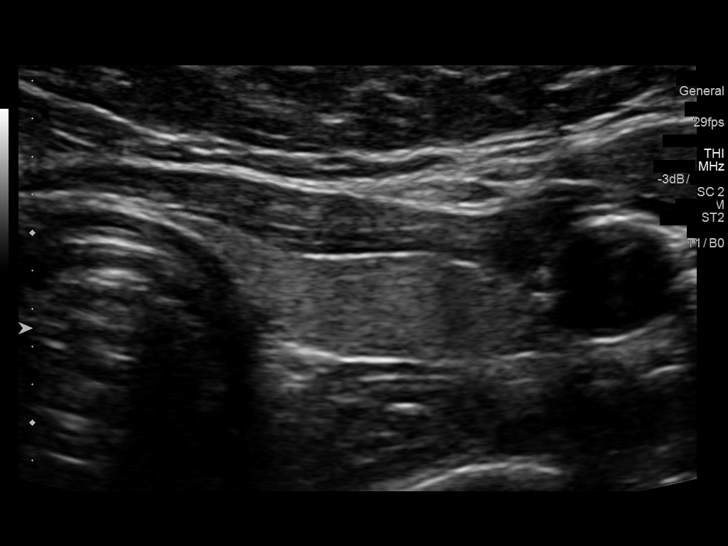
[im 23/35]
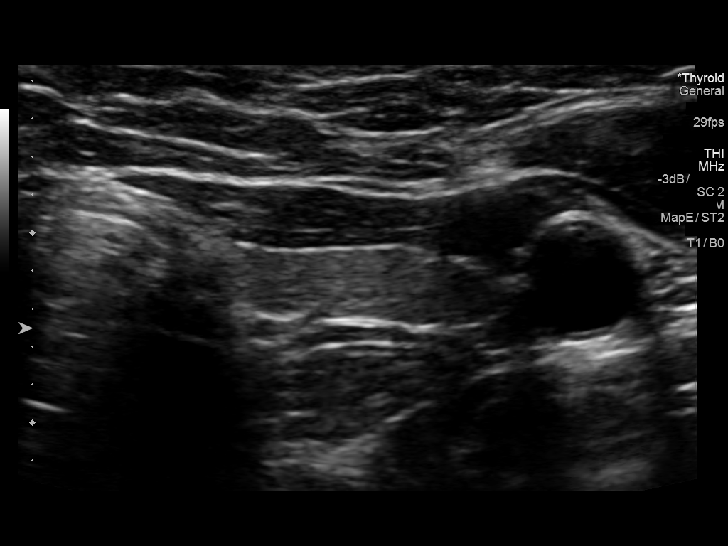
[im 26/35]
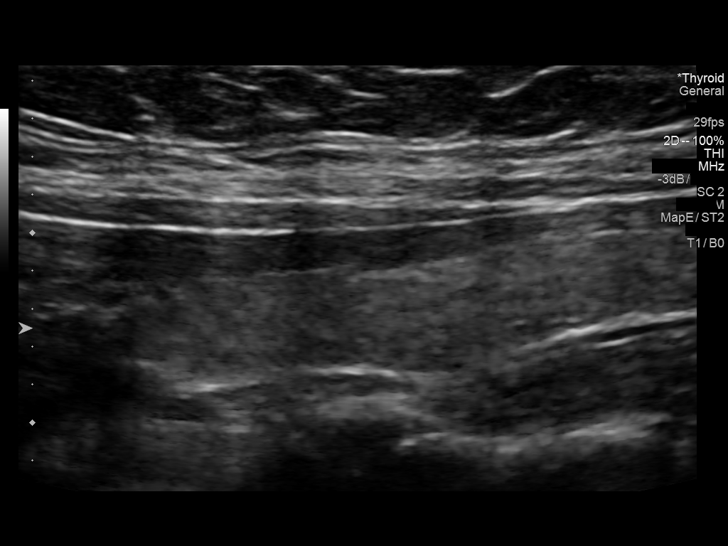
[im 29/35]
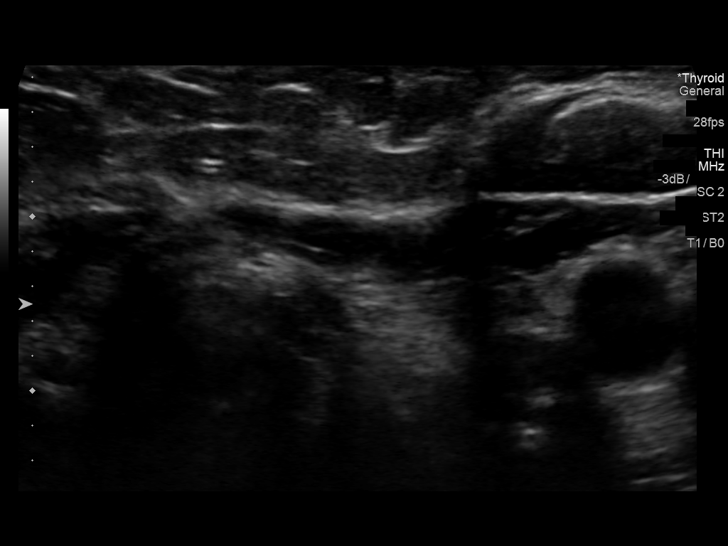
[im 32/35]
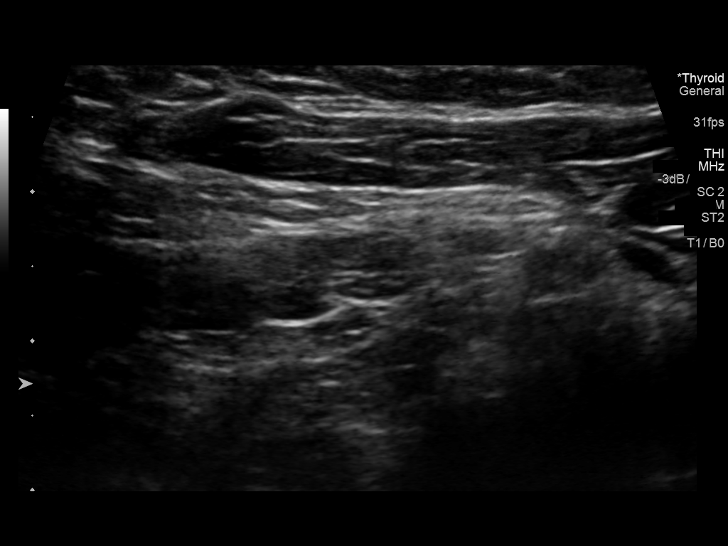
[im 35/35]
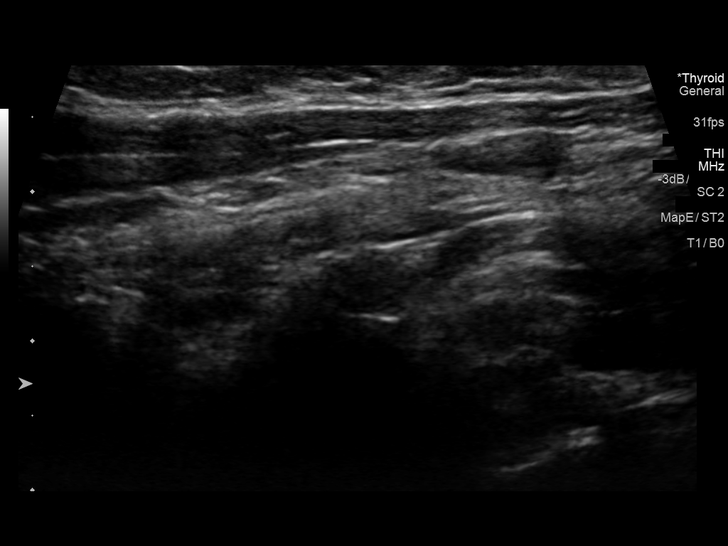

[14 of 25 positions shown; findings below may reference images not displayed]

FINDINGS: Parenchymal Echotexture: Normal

Isthmus: 0.2 cm

Right lobe: 3.9 x 0.8 x 1.3 cm

Left lobe: 4.0 x 0.7 x 1.3 cm

_________________________________________________________

Estimated total number of nodules >/= 1 cm: 0

Number of spongiform nodules >/=  2 cm not described below (TR1): 0

Number of mixed cystic and solid nodules >/= 1.5 cm not described
below (TR2): 0

_________________________________________________________

No discrete nodules are seen within the thyroid gland.
IMPRESSION: Normal sonographic appearance of the thyroid gland.

## 2019-11-12 DIAGNOSIS — Z0000 Encounter for general adult medical examination without abnormal findings: Secondary | ICD-10-CM | POA: Diagnosis not present

## 2020-03-09 ENCOUNTER — Other Ambulatory Visit: Payer: Self-pay | Admitting: Obstetrics and Gynecology

## 2020-03-09 NOTE — Telephone Encounter (Signed)
Medication refill request: OCP Last AEX:  05-11-2019 BS Next AEX: 06-01-20 Last MMG (if hormonal medication request): n/a Refill authorized: Today, please advise.   Medication pended for #84, 0RF. Please refill if appropriate.
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
## 2020-06-01 ENCOUNTER — Encounter: Payer: Self-pay | Admitting: Obstetrics and Gynecology

## 2020-06-01 ENCOUNTER — Ambulatory Visit: Payer: BC Managed Care – PPO | Admitting: Obstetrics and Gynecology

## 2020-06-01 ENCOUNTER — Other Ambulatory Visit (HOSPITAL_COMMUNITY)
Admission: RE | Admit: 2020-06-01 | Discharge: 2020-06-01 | Disposition: A | Discharge status: Home / Self Care | Payer: BC Managed Care – PPO | Source: Ambulatory Visit | Attending: Obstetrics and Gynecology | Admitting: Obstetrics and Gynecology

## 2020-06-01 ENCOUNTER — Other Ambulatory Visit: Payer: Self-pay

## 2020-06-01 VITALS — BP 100/68 | HR 82 | Resp 16 | Ht 65.0 in | Wt 173.4 lb

## 2020-06-01 DIAGNOSIS — Z113 Encounter for screening for infections with a predominantly sexual mode of transmission: Secondary | ICD-10-CM

## 2020-06-01 DIAGNOSIS — Z01419 Encounter for gynecological examination (general) (routine) without abnormal findings: Secondary | ICD-10-CM | POA: Diagnosis present

## 2020-06-01 MED ORDER — LEVONORGEST-ETH ESTRAD 91-DAY 0.15-0.03 MG PO TABS: 1.0000 | ORAL_TABLET | Freq: Every day | ORAL | 3 refills | Status: DC

## 2020-06-01 NOTE — Patient Instructions (Signed)

## 2020-06-02 LAB — CYTOLOGY - PAP
Chlamydia: NEGATIVE
Comment: NEGATIVE
Comment: NEGATIVE
Comment: NEGATIVE
Comment: NORMAL
Diagnosis: NEGATIVE
High risk HPV: NEGATIVE
Neisseria Gonorrhea: NEGATIVE
Trichomonas: NEGATIVE

## 2020-06-02 LAB — HEP, RPR, HIV PANEL
HIV Screen 4th Generation wRfx: NONREACTIVE
Hepatitis B Surface Ag: NEGATIVE
RPR Ser Ql: NONREACTIVE

## 2020-06-02 LAB — HEPATITIS C ANTIBODY: Hep C Virus Ab: <0.1 s/co ratio (ref 0.0–0.9)

## 2020-09-13 ENCOUNTER — Encounter: Payer: Self-pay | Admitting: Allergy and Immunology

## 2020-09-13 ENCOUNTER — Ambulatory Visit: Payer: BC Managed Care – PPO | Admitting: Allergy and Immunology

## 2020-09-13 ENCOUNTER — Other Ambulatory Visit: Payer: Self-pay

## 2020-09-13 VITALS — BP 114/70 | HR 86 | Temp 98.6°F | Resp 12 | Ht 65.0 in | Wt 168.4 lb

## 2020-09-13 DIAGNOSIS — T7800XA Anaphylactic reaction due to unspecified food, initial encounter: Secondary | ICD-10-CM

## 2020-09-13 MED ORDER — EPINEPHRINE 0.3 MG/0.3ML IJ SOAJ: 0.3000 mg | INTRAMUSCULAR | 1 refills | Status: AC | PRN

## 2020-09-13 NOTE — Patient Instructions (Addendum)
Food allergy The patient's history suggests mammalian meat allergy and positive skin test results today confirm this diagnosis.  A laboratory order form has been provided for serum specific IgE against alpha gal panel.  Meticulous avoidance of beef pork and lamb (mammalian meat) as discussed.  A prescription has been provided for epinephrine auto-injector 2 pack along with instructions for proper administration.  A food allergy action plan has been provided and discussed.  Medic Alert identification is recommended.   When lab results have returned you will be called with further recommendations. With the newly implemented Cures Act, the labs may be visible to you at the same time they become visible to [place]. However, the results will typically not be addressed until all of the results are back, so please be patient.  Until you have heard from [place], please continue the treatment plan as outlined on your take home sheet.

## 2020-09-13 NOTE — Progress Notes (Signed)
New Patient Note  RE: Karina Riley MRN: [id number] DOB: 1996/04/10 Date of Office Visit: 09/13/2020  Referring provider: Mitzi Hansen, NP Primary care provider: Marinda Elk, MD  Chief Complaint: Food Intolerance   History of present illness: Karina Riley is a 24 y.o. female seen today in consultation requested by Mitzi Hansen, NP.  She reports that she has been bitten by ticks multiple times throughout her lifetime, most recently approximately 2 months ago.  She did not develop a bull's-eye rash.  Approximately 6 weeks ago she consumed steak and wine.  Approximately 2 hours later she vomited and had a headache.  2 weeks after that, she consumed a cheeseburger and had a headache and vomited within 30 minutes.  Approximately 2 weeks ago she consumed chicken which had been cooked with sausage and "immediately had to throw up" and had a headache.  She did not develop generalized urticaria, angioedema, or cardiopulmonary symptoms during any of these episodes.  She is able to consume all other allergenic foods without problems.  She currently does not have an epinephrine autoinjector.  Assessment and plan: Food allergy The patient's history suggests mammalian meat allergy and positive skin test results today confirm this diagnosis.  A laboratory order form has been provided for serum specific IgE against alpha gal panel.  Meticulous avoidance of beef pork and lamb (mammalian meat) as discussed.  A prescription has been provided for epinephrine auto-injector 2 pack along with instructions for proper administration.  A food allergy action plan has been provided and discussed.  Medic Alert identification is recommended.   Meds ordered this encounter  Medications  . EPINEPHrine (AUVI-Q) 0.3 mg/0.3 mL IJ SOAJ injection    Sig: Inject 0.3 mg into the muscle as needed for anaphylaxis.    Dispense:  2 each    Refill:  1    Diagnostics: Food allergen skin testing: Reactive to beef,  pork, and Lamb with appropriate controls.    Physical examination: Blood pressure 114/70, pulse 86, temperature 98.6 F (37 C), temperature source Oral, resp. rate 12, height 5\' 5"  (1.651 m), weight 168 lb 6.9 oz (76.4 kg), SpO2 98 %.  General: Alert, interactive, in no acute distress. Neck: Supple without lymphadenopathy. Lungs: Clear to auscultation without wheezing, rhonchi or rales. CV: Normal S1, S2 without murmurs. Abdomen: Nondistended, nontender. Skin: Warm and dry, without lesions or rashes. Extremities:  No clubbing, cyanosis or edema. Neuro:   Grossly intact.  Review of systems:  Review of systems negative except as noted in HPI / PMHx or noted below: Review of Systems  Constitutional: Negative.   HENT: Negative.   Eyes: Negative.   Respiratory: Negative.   Cardiovascular: Negative.   Gastrointestinal: Negative.   Genitourinary: Negative.   Musculoskeletal: Negative.   Skin: Negative.   Neurological: Negative.   Endo/Heme/Allergies: Negative.   Psychiatric/Behavioral: Negative.     Past medical history:  Past Medical History:  Diagnosis Date  . Anxiety   . Depression   . GERD (gastroesophageal reflux disease)   . Migraine    no aura.     Past surgical history:  Past Surgical History:  Procedure Laterality Date  . ADENOIDECTOMY  1998  . eustacian tubes as infant    . LABIOPLASTY Right 04/29/2013   Procedure: right labia reduction;  Surgeon: 05/01/2013, MD;  Location: [facility];  Service: Gynecology;  Laterality: Right;  . wisdom teeth removal      Family history: Family History  Problem Relation Age of Onset  .  Osteoarthritis Mother   . Migraines Mother   . Diabetes Mother   . Osteoarthritis Father   . Osteoarthritis Maternal Grandfather   . Hypertension Maternal Grandfather   . Hyperlipidemia Maternal Grandfather   . Stroke Maternal Grandfather   . Thyroid disease Maternal Grandfather   . Throat cancer Maternal Grandfather   . Osteoarthritis  Paternal Grandmother   . Osteoarthritis Paternal Grandfather   . Allergic rhinitis Neg Hx   . Angioedema Neg Hx   . Asthma Neg Hx   . Eczema Neg Hx   . Immunodeficiency Neg Hx   . Urticaria Neg Hx     Social history: Social History   Socioeconomic History  . Marital status: Single    Spouse name: Not on file  . Number of children: Not on file  . Years of education: Not on file  . Highest education level: Not on file  Occupational History  . Not on file  Tobacco Use  . Smoking status: Never Smoker  . Smokeless tobacco: Never Used  Vaping Use  . Vaping Use: Never used  Substance and Sexual Activity  . Alcohol use: Yes    Alcohol/week: 2.0 standard drinks    Types: 2 Standard drinks or equivalent per week  . Drug use: No  . Sexual activity: Yes    Partners: Male    Birth control/protection: Pill    Comment: Vienva  Other Topics Concern  . Not on file  Social History Narrative  . Not on file   Social Determinants of Health   Financial Resource Strain:   . Difficulty of Paying Living Expenses: Not on file  Food Insecurity:   . Worried About Programme researcher, broadcasting/film/video in the Last Year: Not on file  . Ran Out of Food in the Last Year: Not on file  Transportation Needs:   . Lack of Transportation (Medical): Not on file  . Lack of Transportation (Non-Medical): Not on file  Physical Activity:   . Days of Exercise per Week: Not on file  . Minutes of Exercise per Session: Not on file  Stress:   . Feeling of Stress : Not on file  Social Connections:   . Frequency of Communication with Friends and Family: Not on file  . Frequency of Social Gatherings with Friends and Family: Not on file  . Attends Religious Services: Not on file  . Active Member of Clubs or Organizations: Not on file  . Attends Banker Meetings: Not on file  . Marital Status: Not on file  Intimate Partner Violence:   . Fear of Current or Ex-Partner: Not on file  . Emotionally Abused: Not on  file  . Physically Abused: Not on file  . Sexually Abused: Not on file    Environmental History: Patient lives in a 25 year old house with laminate floors throughout, gas heat, and central air.  There is no known mold/water damage in the home.  There are 2 dogs in the home which have access to her bedroom.  She has vaped since 2018.  Current Outpatient Medications  Medication Sig Dispense Refill  . levonorgestrel-ethinyl estradiol (SEASONALE) 0.15-0.03 MG tablet Take 1 tablet by mouth daily. 91 tablet 3  . omeprazole (PRILOSEC) 10 MG capsule Take 1 capsule (10 mg total) by mouth daily.    Marland Kitchen EPINEPHrine (AUVI-Q) 0.3 mg/0.3 mL IJ SOAJ injection Inject 0.3 mg into the muscle as needed for anaphylaxis. 2 each 1  . escitalopram (LEXAPRO) 10 MG tablet Take 10 mg by  mouth daily. (Patient not taking: Reported on 09/13/2020)     No current facility-administered medications for this visit.    Known medication allergies: No Known Allergies  I appreciate the opportunity to take part in [place] care. Please do not hesitate to contact me with questions.  Sincerely,   R. Jorene Guest, MD

## 2020-09-13 NOTE — Assessment & Plan Note (Signed)
The patient's history suggests mammalian meat allergy and positive skin test results today confirm this diagnosis.  A laboratory order form has been provided for serum specific IgE against alpha gal panel.  Meticulous avoidance of beef pork and lamb (mammalian meat) as discussed.  A prescription has been provided for epinephrine auto-injector 2 pack along with instructions for proper administration.  A food allergy action plan has been provided and discussed.  Medic Alert identification is recommended.

## 2020-09-18 LAB — ALPHA-GAL PANEL
Alpha Gal IgE*: <0.1 kU/L (ref <0.10)
Beef (Bos spp) IgE: <0.1 kU/L (ref <0.35)
Class Interpretation: 0
Class Interpretation: 0
Class Interpretation: 0
Lamb/Mutton (Ovis spp) IgE: <0.1 kU/L (ref <0.35)
Pork (Sus spp) IgE: <0.1 kU/L (ref <0.35)

## 2020-12-30 ENCOUNTER — Telehealth: Payer: Self-pay | Admitting: *Deleted

## 2020-12-30 NOTE — Telephone Encounter (Signed)
Pt called requesting a medication to lower her sex drive. Will route to her provider to advice/ recommendations.

## 2021-01-02 NOTE — Telephone Encounter (Signed)
Please schedule an office visit with me. ?

## 2021-01-03 NOTE — Telephone Encounter (Signed)
Spoke with patient and informed her. She wants to wait and call back regarding appt.

## 2021-02-28 ENCOUNTER — Other Ambulatory Visit: Payer: Self-pay | Admitting: Obstetrics and Gynecology

## 2021-05-25 ENCOUNTER — Other Ambulatory Visit: Payer: Self-pay

## 2021-05-25 MED ORDER — LEVONORGEST-ETH ESTRAD 91-DAY 0.15-0.03 MG PO TABS: 1.0000 | ORAL_TABLET | Freq: Every day | ORAL | 0 refills | Status: DC

## 2021-05-25 NOTE — Telephone Encounter (Signed)
Patient's insurance will be ending 06/12/21.  Her new insurance will not be in effect for 90 days.  She tried to r/s her mid Aug appt earlier but nothing available. Appt desk told her they will call her if they get appt cancellation in July.  However, her AEX will need to be November if they don't get a cancellation.  She is asking if you would be willing to refill her bcp's until November.  SHe said she takes them for dysmenorrhea and without them she misses work.

## 2021-05-25 NOTE — Telephone Encounter (Signed)
Spoke with patient and informed her. Rx sent. 

## 2021-06-21 NOTE — Progress Notes (Deleted)
25 y.o. G0P0 Single Caucasian female here for annual exam.    PCP:     No LMP recorded.           Sexually active: {yes no:314532}  The current method of family planning is ***COC (estrogen/progesterone).--Seasonale    Exercising: No.  {types:19826} Smoker:  no  Health Maintenance: Pap: 06-01-20 Neg:Neg HR HPV, 04-25-17 Neg History of abnormal Pap:  no MMG:  n/a Colonoscopy:  n/a BMD:   n/a  Result  n/a TDaP:  2014 Gardasil:   yes HIV:06-01-20 NR Hep C:06-01-20 Neg Screening Labs:  Hb today: ***, Urine today: ***   reports that she has never smoked. She has never used smokeless tobacco. She reports current alcohol use of about 2.0 standard drinks of alcohol per week. She reports that she does not use drugs.  Past Medical History:  Diagnosis Date   Anxiety    Depression    GERD (gastroesophageal reflux disease)    Migraine    no aura.     Past Surgical History:  Procedure Laterality Date   ADENOIDECTOMY  1998   eustacian tubes as infant     LABIOPLASTY Right 04/29/2013   Procedure: right labia reduction;  Surgeon: Melony Overly, MD;  Location: [facility];  Service: Gynecology;  Laterality: Right;   wisdom teeth removal      Current Outpatient Medications  Medication Sig Dispense Refill   EPINEPHrine (AUVI-Q) 0.3 mg/0.3 mL IJ SOAJ injection Inject 0.3 mg into the muscle as needed for anaphylaxis. 2 each 1   escitalopram (LEXAPRO) 10 MG tablet Take 10 mg by mouth daily. (Patient not taking: Reported on 09/13/2020)     levonorgestrel-ethinyl estradiol (SEASONALE) 0.15-0.03 MG tablet Take 1 tablet by mouth daily. 91 tablet 0   omeprazole (PRILOSEC) 10 MG capsule Take 1 capsule (10 mg total) by mouth daily.     No current facility-administered medications for this visit.    Family History  Problem Relation Age of Onset   Osteoarthritis Mother    Migraines Mother    Diabetes Mother    Osteoarthritis Father    Osteoarthritis Maternal Grandfather    Hypertension Maternal  Grandfather    Hyperlipidemia Maternal Grandfather    Stroke Maternal Grandfather    Thyroid disease Maternal Grandfather    Throat cancer Maternal Grandfather    Osteoarthritis Paternal Grandmother    Osteoarthritis Paternal Grandfather    Allergic rhinitis Neg Hx    Angioedema Neg Hx    Asthma Neg Hx    Eczema Neg Hx    Immunodeficiency Neg Hx    Urticaria Neg Hx     Review of Systems  Exam:   There were no vitals taken for this visit.    General appearance: alert, cooperative and appears stated age Head: normocephalic, without obvious abnormality, atraumatic Neck: no adenopathy, supple, symmetrical, trachea midline and thyroid normal to inspection and palpation Lungs: clear to auscultation bilaterally Breasts: normal appearance, no masses or tenderness, No nipple retraction or dimpling, No nipple discharge or bleeding, No axillary adenopathy Heart: regular rate and rhythm Abdomen: soft, non-tender; no masses, no organomegaly Extremities: extremities normal, atraumatic, no cyanosis or edema Skin: skin color, texture, turgor normal. No rashes or lesions Lymph nodes: cervical, supraclavicular, and axillary nodes normal. Neurologic: grossly normal  Pelvic: External genitalia:  no lesions              No abnormal inguinal nodes palpated.  Urethra:  normal appearing urethra with no masses, tenderness or lesions              Bartholins and Skenes: normal                 Vagina: normal appearing vagina with normal color and discharge, no lesions              Cervix: no lesions              Pap taken: {yes no:314532} Bimanual Exam:  Uterus:  normal size, contour, position, consistency, mobility, non-tender              Adnexa: no mass, fullness, tenderness              Rectal exam: {yes no:314532}.  Confirms.              Anus:  normal sphincter tone, no lesions  Chaperone was present for exam:  ***  Assessment:   Well woman visit with gynecologic  exam.   Plan: Mammogram screening discussed. Self breast awareness reviewed. Pap and HR HPV as above. Guidelines for Calcium, Vitamin D, regular exercise program including cardiovascular and weight bearing exercise.   Follow up annually and prn.   Additional counseling given.  {yes [id number]. _______ minutes face to face time of which over 50% was spent in counseling.    After visit summary provided.

## 2021-06-28 ENCOUNTER — Ambulatory Visit: Payer: BC Managed Care – PPO | Admitting: Obstetrics and Gynecology

## 2021-07-26 ENCOUNTER — Other Ambulatory Visit: Payer: Self-pay

## 2021-07-26 ENCOUNTER — Emergency Department (HOSPITAL_BASED_OUTPATIENT_CLINIC_OR_DEPARTMENT_OTHER)
Admission: EM | Admit: 2021-07-26 | Discharge: 2021-07-26 | Disposition: A | Discharge status: Home / Self Care | Payer: Self-pay | Attending: Emergency Medicine | Admitting: Emergency Medicine

## 2021-07-26 ENCOUNTER — Encounter (HOSPITAL_BASED_OUTPATIENT_CLINIC_OR_DEPARTMENT_OTHER): Payer: Self-pay | Admitting: *Deleted

## 2021-07-26 ENCOUNTER — Emergency Department (HOSPITAL_BASED_OUTPATIENT_CLINIC_OR_DEPARTMENT_OTHER): Payer: Self-pay

## 2021-07-26 DIAGNOSIS — B9689 Other specified bacterial agents as the cause of diseases classified elsewhere: Secondary | ICD-10-CM | POA: Insufficient documentation

## 2021-07-26 DIAGNOSIS — K529 Noninfective gastroenteritis and colitis, unspecified: Secondary | ICD-10-CM | POA: Insufficient documentation

## 2021-07-26 DIAGNOSIS — R5383 Other fatigue: Secondary | ICD-10-CM | POA: Insufficient documentation

## 2021-07-26 DIAGNOSIS — N12 Tubulo-interstitial nephritis, not specified as acute or chronic: Secondary | ICD-10-CM | POA: Insufficient documentation

## 2021-07-26 DIAGNOSIS — K921 Melena: Secondary | ICD-10-CM | POA: Insufficient documentation

## 2021-07-26 LAB — LIPASE, BLOOD: Lipase: 28 U/L (ref 11–51)

## 2021-07-26 LAB — CBC
HCT: 37.9 % (ref 36.0–46.0)
Hemoglobin: 12.6 g/dL (ref 12.0–15.0)
MCH: 29.2 pg (ref 26.0–34.0)
MCHC: 33.2 g/dL (ref 30.0–36.0)
MCV: 87.9 fL (ref 80.0–100.0)
Platelets: 342 10*3/uL (ref 150–400)
RBC: 4.31 MIL/uL (ref 3.87–5.11)
RDW: 12.5 % (ref 11.5–15.5)
WBC: 10.7 10*3/uL — ABNORMAL HIGH (ref 4.0–10.5)
nRBC: 0 % (ref 0.0–0.2)

## 2021-07-26 LAB — URINALYSIS, ROUTINE W REFLEX MICROSCOPIC
Bilirubin Urine: NEGATIVE
Glucose, UA: NEGATIVE mg/dL
Ketones, ur: NEGATIVE mg/dL
Nitrite: NEGATIVE
Protein, ur: NEGATIVE mg/dL
Specific Gravity, Urine: 1.03 (ref 1.005–1.030)
pH: 5 (ref 5.0–8.0)

## 2021-07-26 LAB — COMPREHENSIVE METABOLIC PANEL
ALT: 10 U/L (ref 0–44)
AST: 13 U/L — ABNORMAL LOW (ref 15–41)
Albumin: 3.5 g/dL (ref 3.5–5.0)
Alkaline Phosphatase: 53 U/L (ref 38–126)
Anion gap: 7 (ref 5–15)
BUN: 9 mg/dL (ref 6–20)
CO2: 26 mmol/L (ref 22–32)
Calcium: 8.8 mg/dL — ABNORMAL LOW (ref 8.9–10.3)
Chloride: 102 mmol/L (ref 98–111)
Creatinine, Ser: 0.73 mg/dL (ref 0.44–1.00)
GFR, Estimated: >60 mL/min (ref >60)
Glucose, Bld: 91 mg/dL (ref 70–99)
Potassium: 3.6 mmol/L (ref 3.5–5.1)
Sodium: 135 mmol/L (ref 135–145)
Total Bilirubin: 0.2 mg/dL — ABNORMAL LOW (ref 0.3–1.2)
Total Protein: 7.4 g/dL (ref 6.5–8.1)

## 2021-07-26 LAB — PREGNANCY, URINE: Preg Test, Ur: NEGATIVE

## 2021-07-26 LAB — URINALYSIS, MICROSCOPIC (REFLEX): WBC, UA: >50 WBC/hpf (ref 0–5)

## 2021-07-26 LAB — SEDIMENTATION RATE: Sed Rate: 57 mm/h — ABNORMAL HIGH (ref 0–22)

## 2021-07-26 MED ORDER — IOHEXOL 350 MG/ML SOLN
85.0000 mL | Freq: Once | INTRAVENOUS | Status: AC | PRN
  Administered 2021-07-26: 85 mL via INTRAVENOUS

## 2021-07-26 MED ORDER — LACTATED RINGERS IV BOLUS
1000.0000 mL | Freq: Once | INTRAVENOUS | Status: AC
  Administered 2021-07-26: 1000 mL via INTRAVENOUS

## 2021-07-26 MED ORDER — CIPROFLOXACIN HCL 500 MG PO TABS: 500.0000 mg | ORAL_TABLET | Freq: Two times a day (BID) | ORAL | 0 refills | Status: AC

## 2021-07-26 MED ORDER — MORPHINE SULFATE (PF) 4 MG/ML IV SOLN
4.0000 mg | Freq: Once | INTRAVENOUS | Status: AC
  Administered 2021-07-26: 4 mg via INTRAVENOUS
  Filled 2021-07-26: qty 1

## 2021-07-26 MED ORDER — ONDANSETRON 4 MG PO TBDP: 4.0000 mg | ORAL_TABLET | Freq: Three times a day (TID) | ORAL | 0 refills | Status: DC | PRN

## 2021-07-26 MED ORDER — SODIUM CHLORIDE 0.9 % IV SOLN
1.0000 g | Freq: Once | INTRAVENOUS | Status: AC
  Administered 2021-07-26: 1 g via INTRAVENOUS
  Filled 2021-07-26: qty 10

## 2021-07-26 NOTE — Discharge Instructions (Signed)
There are prescriptions attached.  The ciprofloxacin is an antibiotic that treats both pyelonephritis and colitis.  Take this as prescribed.  Additionally, there is a medication to treat nausea (Zofran). Take this only as needed.  You should follow-up with your primary care doctor within the next 2 days.  This will be a chance to find out results of urine culture, adjust antibiotics as needed, and to send stool sample for testing.  Some people with your condition do need to be hospitalized.  If you develop any worsening symptoms, please return to the emergency department.

## 2021-07-26 NOTE — ED Notes (Signed)
Hemeoccult card, gipanel specimen cup and sterile specimen cup provided for home collection of stool sample

## 2021-07-26 NOTE — ED Notes (Signed)
ED Provider at bedside. 

## 2021-07-26 NOTE — ED Provider Notes (Signed)
[facility] EMERGENCY DEPARTMENT Provider Note   CSN: [id number] Arrival date & time: 07/26/21  1241     History Chief Complaint  Patient presents with   Diarrhea    Karina Riley is a 25 y.o. female.   Diarrhea Associated symptoms: abdominal pain and chills   Associated symptoms: no arthralgias, no fever and no vomiting   Abdominal Pain Pain location:  Generalized Pain quality: aching and cramping   Pain radiates to:  Does not radiate Pain severity:  Moderate Onset quality:  Gradual Duration:  6 days Timing:  Intermittent Progression:  Worsening Chronicity:  New Relieved by:  None tried Ineffective treatments:  None tried Associated symptoms: chills, diarrhea, dysuria, fatigue, hematochezia and nausea   Associated symptoms: no chest pain, no cough, no fever, no hematuria, no shortness of breath, no sore throat, no vaginal bleeding, no vaginal discharge and no vomiting       Past Medical History:  Diagnosis Date   Anxiety    Depression    GERD (gastroesophageal reflux disease)    Migraine    no aura.     Patient Active Problem List   Diagnosis Date Noted   Food allergy 09/13/2020   Depression with suicidal ideation 10/27/2013    Past Surgical History:  Procedure Laterality Date   ADENOIDECTOMY  1998   eustacian tubes as infant     LABIOPLASTY Right 04/29/2013   Procedure: right labia reduction;  Surgeon: Melony Overly, MD;  Location: [facility];  Service: Gynecology;  Laterality: Right;   wisdom teeth removal       OB History     Gravida  0   Para      Term      Preterm      AB      Living         SAB      IAB      Ectopic      Multiple      Live Births              Family History  Problem Relation Age of Onset   Osteoarthritis Mother    Migraines Mother    Diabetes Mother    Osteoarthritis Father    Osteoarthritis Maternal Grandfather    Hypertension Maternal Grandfather    Hyperlipidemia Maternal Grandfather     Stroke Maternal Grandfather    Thyroid disease Maternal Grandfather    Throat cancer Maternal Grandfather    Osteoarthritis Paternal Grandmother    Osteoarthritis Paternal Grandfather    Allergic rhinitis Neg Hx    Angioedema Neg Hx    Asthma Neg Hx    Eczema Neg Hx    Immunodeficiency Neg Hx    Urticaria Neg Hx     Social History   Tobacco Use   Smoking status: Never   Smokeless tobacco: Never  Vaping Use   Vaping Use: Never used  Substance Use Topics   Alcohol use: Yes    Alcohol/week: 2.0 standard drinks    Types: 2 Standard drinks or equivalent per week   Drug use: No    Home Medications Prior to Admission medications   Medication Sig Start Date End Date Taking? Authorizing Provider  ciprofloxacin (CIPRO) 500 MG tablet Take 1 tablet (500 mg total) by mouth every 12 (twelve) hours for 7 days. 07/26/21 08/02/21 Yes Gloris Manchester, MD  ondansetron (ZOFRAN ODT) 4 MG disintegrating tablet Take 1 tablet (4 mg total) by mouth every 8 (eight) hours  as needed for up to 12 doses for nausea or vomiting. 07/26/21  Yes Gloris Manchester, MD  EPINEPHrine (AUVI-Q) 0.3 mg/0.3 mL IJ SOAJ injection Inject 0.3 mg into the muscle as needed for anaphylaxis. 09/13/20   Bobbitt, Heywood Iles, MD  escitalopram (LEXAPRO) 10 MG tablet Take 10 mg by mouth daily. Patient not taking: Reported on 09/13/2020 03/04/19   [provider]  levonorgestrel-ethinyl estradiol (SEASONALE) 0.15-0.03 MG tablet Take 1 tablet by mouth daily. 05/25/21   Patton Salles, MD  omeprazole (PRILOSEC) 10 MG capsule Take 1 capsule (10 mg total) by mouth daily. 10/31/13   Charm Rings, NP    Allergies    Patient has no known allergies.  Review of Systems   Review of Systems  Constitutional:  Positive for chills and fatigue. Negative for fever.  HENT:  Negative for ear pain and sore throat.   Eyes:  Negative for pain and visual disturbance.  Respiratory:  Negative for cough and shortness of breath.    Cardiovascular:  Negative for chest pain and palpitations.  Gastrointestinal:  Positive for abdominal pain, diarrhea, hematochezia and nausea. Negative for vomiting.  Genitourinary:  Positive for dysuria. Negative for hematuria, pelvic pain, vaginal bleeding and vaginal discharge.  Musculoskeletal:  Negative for arthralgias and neck pain.  Skin:  Negative for color change, pallor and rash.  Neurological:  Negative for seizures and syncope.  All other systems reviewed and are negative.  Physical Exam Updated Vital Signs BP 119/88 (BP Location: Left Arm)   Pulse 80   Temp 98.5 F (36.9 C) (Oral)   Resp 16   Ht 5\' 6"  (1.676 m)   Wt 72.6 kg   SpO2 100%   BMI 25.82 kg/m   Physical Exam Vitals and nursing note reviewed.  Constitutional:      General: She is not in acute distress.    Appearance: Normal appearance. She is well-developed and normal weight. She is not ill-appearing, toxic-appearing or diaphoretic.  HENT:     Head: Normocephalic and atraumatic.     Right Ear: External ear normal.     Left Ear: External ear normal.     Nose: Nose normal.  Eyes:     Extraocular Movements: Extraocular movements intact.     Conjunctiva/sclera: Conjunctivae normal.  Cardiovascular:     Rate and Rhythm: Normal rate and regular rhythm.     Heart sounds: No murmur heard. Pulmonary:     Effort: Pulmonary effort is normal. No respiratory distress.     Breath sounds: Normal breath sounds. No wheezing.  Chest:     Chest wall: No tenderness.  Abdominal:     Palpations: Abdomen is soft.     Tenderness: There is abdominal tenderness. There is left CVA tenderness. There is no right CVA tenderness, guarding or rebound.  Musculoskeletal:     Cervical back: Normal range of motion and neck supple.     Right lower leg: No edema.     Left lower leg: No edema.  Skin:    General: Skin is warm and dry.     Capillary Refill: Capillary refill takes less than 2 seconds.     Coloration: Skin is pale.  Skin is not jaundiced.  Neurological:     General: No focal deficit present.     Mental Status: She is alert and oriented to person, place, and time.     Cranial Nerves: No cranial nerve deficit.     Sensory: No sensory deficit.  Motor: No weakness.  Psychiatric:        Mood and Affect: Mood normal.        Behavior: Behavior normal.        Thought Content: Thought content normal.        Judgment: Judgment normal.    ED Results / Procedures / Treatments   Labs (all labs ordered are listed, but only abnormal results are displayed) Labs Reviewed  COMPREHENSIVE METABOLIC PANEL - Abnormal; Notable for the following components:      Result Value   Calcium 8.8 (*)    AST 13 (*)    Total Bilirubin 0.2 (*)    All other components within normal limits  CBC - Abnormal; Notable for the following components:   WBC 10.7 (*)    All other components within normal limits  URINALYSIS, ROUTINE W REFLEX MICROSCOPIC - Abnormal; Notable for the following components:   APPearance CLOUDY (*)    Hgb urine dipstick SMALL (*)    Leukocytes,Ua LARGE (*)    All other components within normal limits  URINALYSIS, MICROSCOPIC (REFLEX) - Abnormal; Notable for the following components:   Bacteria, UA MANY (*)    All other components within normal limits  SEDIMENTATION RATE - Abnormal; Notable for the following components:   Sed Rate 57 (*)    All other components within normal limits  URINE CULTURE  LIPASE, BLOOD  PREGNANCY, URINE    EKG None  Radiology CT ABDOMEN PELVIS W CONTRAST  Result Date: 07/26/2021 CLINICAL DATA:  Abdominal abscess/infection suspected.  n/d x 6 days EXAM: CT ABDOMEN AND PELVIS WITH CONTRAST TECHNIQUE: Multidetector CT imaging of the abdomen and pelvis was performed using the standard protocol following bolus administration of intravenous contrast. CONTRAST:  40mL OMNIPAQUE IOHEXOL 350 MG/ML SOLN COMPARISON:  CT abdomen pelvis 11/20/2010 FINDINGS: Lower chest: No acute  abnormality. Hepatobiliary: Liver measures up to 19 cm. No focal liver abnormality. No gallstones, gallbladder wall thickening, or pericholecystic fluid. No biliary dilatation. Pancreas: No focal lesion. Normal pancreatic contour. No surrounding inflammatory changes. No main pancreatic ductal dilatation. Spleen: Normal in size without focal abnormality. Adrenals/Urinary Tract: No adrenal nodule bilaterally. Bilateral kidneys enhance symmetrically. No hydronephrosis. No hydroureter. The urinary bladder is unremarkable. Stomach/Bowel: Stomach is within normal limits. Diffuse large bowel wall thickening and trace pericolonic fat stranding. Question some inflammation of a short segment of terminal ileum. No small or large bowel dilatation. Appendix appears normal. Vascular/Lymphatic: No abdominal aorta or iliac aneurysm. No abdominal, pelvic, or inguinal lymphadenopathy. Right lower quadrant mesentery borderline enlarged lymph nodes. Prominent but nonenlarged perirectal lymph nodes. Reproductive: Uterus and bilateral adnexa are unremarkable. Other: No intraperitoneal free fluid. No intraperitoneal free gas. No organized fluid collection. Musculoskeletal: No abdominal wall hernia or abnormality. No suspicious lytic or blastic osseous lesions. No acute displaced fracture. IMPRESSION: 1. Pancolitis. Question some inflammation of a short segment of terminal ileum. Differential diagnosis for etiology includes infection or inflammation. 2. Right lower quadrant mesentery borderline enlarged lymph nodes and prominent but nonenlarged perirectal lymph nodes that are likely reactive in etiology. Recommend attention on follow-up. 3. Mild hepatomegaly. Electronically Signed   By: Tish Frederickson M.D.   On: 07/26/2021 18:34    Procedures Procedures   Medications Ordered in ED Medications  lactated ringers bolus 1,000 mL ( Intravenous Stopped 07/26/21 1956)  morphine 4 MG/ML injection 4 mg (4 mg Intravenous Given 07/26/21 1759)   cefTRIAXone (ROCEPHIN) 1 g in sodium chloride 0.9 % 100 mL IVPB (0 g Intravenous  Stopped 07/26/21 1828)  iohexol (OMNIPAQUE) 350 MG/ML injection 85 mL (85 mLs Intravenous Contrast Given 07/26/21 1813)    ED Course  I have reviewed the triage vital signs and the nursing notes.  Pertinent labs & imaging results that were available during my care of the patient were reviewed by me and considered in my medical decision making (see chart for details).    MDM Rules/Calculators/A&P                           Patient presents for diarrhea that progressed over the past week to hematochezia, described as presence of small amount of dark blood.  She is also had abdominal pain, dysuria, fatigue, and chills.  Afebrile upon arrival in the ED.  Patient appears uncomfortable but nontoxic.  Work-up was initiated prior to being bedded in the ED.  Results show evidence of acute urinary infection.  On exam, patient does have left CVA tenderness and some generalized abdominal tenderness.  Presentation consistent with pyelonephritis, however, this does not explain her bloody diarrhea and abdominal tenderness.  CT scan ordered for further evaluation of acute pathology.  Bolus of IV fluids and ceftriaxone given in the ED.  Morphine given for analgesia.  Patient denies any current nausea.  Stool studies were ordered, however, patient was not able to provide a sample in the ED.  CT scan showed pancolitis.  Patient was able to tolerate p.o. intake in the ED.  Ciprofloxacin was prescribed for trial of outpatient therapy.  As needed Zofran was prescribed for recurrence of nausea.  Patient was encouraged to return to the ED if she does have any worsening symptoms or p.o. intolerance.  She was discharged in stable condition.  Final Clinical Impression(s) / ED Diagnoses Final diagnoses:  Pyelonephritis  Colitis    Rx / DC Orders ED Discharge Orders          Ordered    ciprofloxacin (CIPRO) 500 MG tablet  Every 12 hours         07/26/21 2142    ondansetron (ZOFRAN ODT) 4 MG disintegrating tablet  Every 8 hours PRN        07/26/21 2142             Gloris Manchester, MD 07/27/21 1245

## 2021-07-26 NOTE — ED Triage Notes (Signed)
C/o n/d x 6 days  

## 2021-07-28 LAB — URINE CULTURE: Culture: >=100000 — AB

## 2021-07-30 ENCOUNTER — Telehealth: Payer: Self-pay | Admitting: Emergency Medicine

## 2021-07-30 NOTE — Telephone Encounter (Signed)
Post ED Visit - Positive Culture Follow-up  Culture report reviewed by antimicrobial stewardship pharmacist: [facility] []  , Pharm.D. []  Enzo Bi, Pharm.D., BCPS AQ-ID []  , Pharm.D., BCPS []  Celedonio Miyamoto, Pharm.D., BCPS []  Pajaros, Garvin Fila.D., BCPS, AAHIVP []  , Pharm.D., BCPS, AAHIVP [x]  Georgina Pillion, PharmD, BCPS []  , PharmD, BCPS []  [place], PharmD, BCPS []  [address], PharmD []  , PharmD, BCPS []  Estella Husk, PharmD  Pharmacy Team []  Lysle Pearl, PharmD []  , PharmD []  Phillips Climes, PharmD []  , Rph []  Agapito Games) , PharmD []  Verlan Friends, PharmD []  , PharmD []  Mervyn Gay, PharmD []  , PharmD []  Vinnie Level, PharmD []  [facility], PharmD []  , PharmD []  Len Childs, PharmD   Positive urine culture No further patient follow-up is required at this time.  Zelena Bushong 07/30/2021, 11:55 AM

## 2021-08-25 NOTE — Progress Notes (Signed)
25 y.o. G0P0 Single Caucasian female here for annual exam.    Pyelonephritis/colitis 07/26/21.  Treatment completed.  Let go from job.  Moved out of her house. Moved back home.  Current menses is long than usual.  Mild tenderness.   Wants to continue her COCs.  Same partner.  She declines STD screening.  Her partner is in the [employer] and has obligatory testing yearly.  He tested negative recently.  They use condoms.   On MVI.   PCP:   Marinda Elk, MD  Patient's last menstrual period was 08/26/2021.     Period Duration (Days): 3-5 Period Pattern: Regular (cycle every with birth control) Menstrual Flow: Moderate Menstrual Control: Maxi pad, Tampon Dysmenorrhea: (!) Mild Dysmenorrhea Symptoms: Cramping     Sexually active: Yes.    The current method of family planning is OCP (estrogen/progesterone).    Exercising: Yes.     Ride horses Smoker:  no  Health Maintenance: Pap:  06-01-20 neg HPV HR neg, 04-25-17 neg History of abnormal Pap:  no MMG:  n/a Colonoscopy:  n/a BMD:   n/a  Result  n/a TDaP:  2014 Gardasil:   yes HIV: 2021 neg Hep C: 2021 neg Screening Labs:  not needed.  Flu vaccine:  completed.  Covid booster x 2.    reports that she has never smoked. She has never used smokeless tobacco. She reports current alcohol use of about 2.0 standard drinks per week. She reports that she does not use drugs.  Past Medical History:  Diagnosis Date   Allergy to alpha-gal    Anxiety    Colitis    Depression    GERD (gastroesophageal reflux disease)    Migraine    no aura.     Past Surgical History:  Procedure Laterality Date   ADENOIDECTOMY  1998   eustacian tubes as infant     LABIOPLASTY Right 04/29/2013   Procedure: right labia reduction;  Surgeon: Melony Overly, MD;  Location: [facility];  Service: Gynecology;  Laterality: Right;   wisdom teeth removal      Current Outpatient Medications  Medication Sig Dispense Refill   levonorgestrel-ethinyl  estradiol (SEASONALE) 0.15-0.03 MG tablet Take 1 tablet by mouth daily. 91 tablet 0   omeprazole (PRILOSEC) 10 MG capsule Take 10 mg by mouth as needed.     EPINEPHrine (AUVI-Q) 0.3 mg/0.3 mL IJ SOAJ injection Inject 0.3 mg into the muscle as needed for anaphylaxis. (Patient not taking: Reported on 09/01/2021) 2 each 1   No current facility-administered medications for this visit.    Family History  Problem Relation Age of Onset   Osteoarthritis Mother    Migraines Mother    Diabetes Mother    Osteoarthritis Father    Osteoarthritis Maternal Grandfather    Hypertension Maternal Grandfather    Hyperlipidemia Maternal Grandfather    Stroke Maternal Grandfather    Thyroid disease Maternal Grandfather    Throat cancer Maternal Grandfather    Osteoarthritis Paternal Grandmother    Osteoarthritis Paternal Grandfather    Eczema Neg Hx    Immunodeficiency Neg Hx    Urticaria Neg Hx     Review of Systems  All other systems reviewed and are negative.   Exam:   BP 110/74   Pulse 97   Resp 16   Ht 5\' 5"  (1.651 m)   Wt 159 lb (72.1 kg)   LMP 08/26/2021   BMI 26.46 kg/m     General appearance: alert, cooperative and appears stated  age Head: normocephalic, without obvious abnormality, atraumatic Neck: no adenopathy, supple, symmetrical, trachea midline and thyroid normal to inspection and palpation Lungs: clear to auscultation bilaterally Breasts: normal appearance, no masses or tenderness, No nipple retraction or dimpling, No nipple discharge or bleeding, No axillary adenopathy Heart: regular rate and rhythm Abdomen: soft, non-tender; no masses, no organomegaly Extremities: extremities normal, atraumatic, no cyanosis or edema Skin: skin color, texture, turgor normal. No rashes or lesions Lymph nodes: cervical, supraclavicular, and axillary nodes normal. Neurologic: grossly normal  Pelvic: External genitalia:  no lesions              No abnormal inguinal nodes palpated.               Urethra:  normal appearing urethra with no masses, tenderness or lesions              Bartholins and Skenes: normal                 Vagina: normal appearing vagina with normal color and discharge, no lesions              Cervix: no lesions.  Menstrual flow.  No CMT.               Pap taken: no Bimanual Exam:  Uterus:  normal size, contour, position, consistency, mobility, non-tender              Adnexa: no mass, fullness, tenderness              Chaperone was present for exam:  Joy, CMA  Assessment:   Well woman visit with gynecologic exam. Thyromegaly.  Normal thyroid US.  Normal TSH and T4.  Migraine without aura.  Recent pyelonephritis.   Plan: Mammogram screening discussed. Self breast awareness reviewed. Pap 2024. Guidelines for Calcium, Vitamin D, regular exercise program including cardiovascular and weight bearing exercise. Refill of COCs for one year. Follow up annually and prn.    After visit summary provided.

## 2021-09-01 ENCOUNTER — Encounter: Payer: Self-pay | Admitting: Obstetrics and Gynecology

## 2021-09-01 ENCOUNTER — Ambulatory Visit (INDEPENDENT_AMBULATORY_CARE_PROVIDER_SITE_OTHER): Payer: Self-pay | Admitting: Obstetrics and Gynecology

## 2021-09-01 ENCOUNTER — Other Ambulatory Visit: Payer: Self-pay

## 2021-09-01 VITALS — BP 110/74 | HR 97 | Resp 16 | Ht 65.0 in | Wt 159.0 lb

## 2021-09-01 DIAGNOSIS — Z01419 Encounter for gynecological examination (general) (routine) without abnormal findings: Secondary | ICD-10-CM

## 2021-09-01 MED ORDER — LEVONORGEST-ETH ESTRAD 91-DAY 0.15-0.03 MG PO TABS: 1.0000 | ORAL_TABLET | Freq: Every day | ORAL | 4 refills | Status: DC

## 2021-09-01 NOTE — Patient Instructions (Signed)

## 2021-11-16 ENCOUNTER — Telehealth: Payer: Self-pay

## 2021-11-16 NOTE — Telephone Encounter (Signed)
Patient restarted generic Seasonale at the end of October. She has been off for a month and a half as her prescription had lapsed. She restarted it with menses. No missed or late pills. She said first month she was fine and then she had bleeding like a period that lasted 7 days. She continued taking daily pill and woke today to bleeding like a period again.   She said when she took this previously she never had any BTB.  I did advise that the most common side affect to starting a pill is BTB and continuing to take her pill every day at same time every day she may see improvement after 3 mos.  I told her I will run it by Dr. Quincy Simmonds to see her recommendation as well.

## 2021-11-17 NOTE — Telephone Encounter (Signed)
Left detailed message per DPR  

## 2021-11-17 NOTE — Telephone Encounter (Signed)
I agree with your response to the patient.  If the bleeding is really bothersome to her, she can take one pill by mouth twice a day for 5 days to try to stop the bleeding. Then return to taking one pill by mouth daily.  This is not necessary, however, and may cause her to have some nausea.

## 2022-09-12 ENCOUNTER — Ambulatory Visit (INDEPENDENT_AMBULATORY_CARE_PROVIDER_SITE_OTHER): Payer: BC Managed Care – PPO | Admitting: Obstetrics and Gynecology

## 2022-09-12 ENCOUNTER — Encounter: Payer: Self-pay | Admitting: Obstetrics and Gynecology

## 2022-09-12 VITALS — BP 122/80 | Ht 65.0 in | Wt 171.0 lb

## 2022-09-12 DIAGNOSIS — Z01419 Encounter for gynecological examination (general) (routine) without abnormal findings: Secondary | ICD-10-CM

## 2022-09-12 MED ORDER — LEVONORGEST-ETH ESTRAD 91-DAY 0.15-0.03 MG PO TABS: 1.0000 | ORAL_TABLET | Freq: Every day | ORAL | 4 refills | Status: DC

## 2022-09-12 NOTE — Progress Notes (Signed)
26 y.o. G0P0 Single Caucasian female here for annual exam. Patient would like to discuss weight gain.  Birth control pills are working well.   No partner change.  Declines STD testing.  Partner has already tested negative.  Dealing with stress and hair loss.  States stress makes her hold on to her weight, but she is not stress eating.  She declines having her thyroid function checked.  Denies suicidal ideation.   Does not currently have a therapist.   PCP:   Consuelo Pandy, MD  Patient's last menstrual period was 09/02/2022.     Period Pattern: Regular Menstrual Flow: Moderate Menstrual Control: Maxi pad Dysmenorrhea: (!) Mild     Sexually active: Yes.    The current method of family planning is seasonale .    Exercising: Yes.    Home exercise routine includes walking 1 hrs per day. Smoker:  no  Health Maintenance: Pap:  06-01-20 neg HPV HR neg, 04-25-17 neg History of abnormal Pap:  no MMG:  n/a Colonoscopy:  n/a BMD:  n/a TDaP:  2014 Gardasil:   yes HIV: neg 06/01/20 Hep C: neg 06/01/20 Screening Labs: PCP Flu vaccine:  declined.  Covid vaccine:  discussed.   reports that she has never smoked. She has never used smokeless tobacco. She reports current alcohol use of about 2.0 standard drinks of alcohol per week. She reports that she does not use drugs.  Past Medical History:  Diagnosis Date   Allergy to alpha-gal    Anxiety    Colitis    Depression    GERD (gastroesophageal reflux disease)    Migraine    no aura.     Past Surgical History:  Procedure Laterality Date   ADENOIDECTOMY  1998   eustacian tubes as infant     LABIOPLASTY Right 04/29/2013   Procedure: right labia reduction;  Surgeon: Arloa Koh, MD;  Location: [facility];  Service: Gynecology;  Laterality: Right;   wisdom teeth removal      Current Outpatient Medications  Medication Sig Dispense Refill   EPINEPHrine (AUVI-Q) 0.3 mg/0.3 mL IJ SOAJ injection Inject 0.3 mg into the muscle as needed for  anaphylaxis. 2 each 1   levonorgestrel-ethinyl estradiol (SEASONALE) 0.15-0.03 MG tablet Take 1 tablet by mouth daily. 91 tablet 4   omeprazole (PRILOSEC) 10 MG capsule Take 10 mg by mouth as needed.     No current facility-administered medications for this visit.    Family History  Problem Relation Age of Onset   Osteoarthritis Mother    Migraines Mother    Diabetes Mother    Osteoarthritis Father    Osteoarthritis Maternal Grandfather    Hypertension Maternal Grandfather    Hyperlipidemia Maternal Grandfather    Stroke Maternal Grandfather    Thyroid disease Maternal Grandfather    Throat cancer Maternal Grandfather    Osteoarthritis Paternal Grandmother    Osteoarthritis Paternal Grandfather    Eczema Neg Hx    Immunodeficiency Neg Hx    Urticaria Neg Hx     Review of Systems  Constitutional:        Weight gain    Exam:   BP 122/80 (BP Location: Right Arm, Patient Position: Sitting, Cuff Size: Normal)   Ht 5\' 5"  (1.651 m)   Wt 171 lb (77.6 kg)   LMP 09/02/2022   BMI 28.46 kg/m     General appearance: alert, cooperative and appears stated age Head: normocephalic, without obvious abnormality, atraumatic Neck: no adenopathy, supple, symmetrical, trachea midline and  thyroid normal to inspection and palpation Lungs: clear to auscultation bilaterally Breasts: normal appearance, no masses or tenderness, No nipple retraction or dimpling, No nipple discharge or bleeding, No axillary adenopathy Heart: regular rate and rhythm Abdomen: soft, non-tender; no masses, no organomegaly Extremities: extremities normal, atraumatic, no cyanosis or edema Skin: skin color, texture, turgor normal. No rashes or lesions Lymph nodes: cervical, supraclavicular, and axillary nodes normal. Neurologic: grossly normal  Pelvic: External genitalia:  no lesions              No abnormal inguinal nodes palpated.              Urethra:  normal appearing urethra with no masses, tenderness or lesions               Bartholins and Skenes: normal                 Vagina: normal appearing vagina with normal color and discharge, no lesions              Cervix: no lesions              Pap taken: no Bimanual Exam:  Uterus:  normal size, contour, position, consistency, mobility, non-tender              Adnexa: no mass, fullness, tenderness               Chaperone was present for exam:  Kimalexis, CMA  Assessment:   Well woman visit with gynecologic exam. Hx thyromegaly.  Normal thyroid US.  Normal TSH and T4.  Migraine without aura.  HX pyelonephritis.  Work stress.   Plan: Mammogram screening discussed. Self breast awareness reviewed. Pap and HR HPV as above. Guidelines for Calcium, Vitamin D, regular exercise program including cardiovascular and weight bearing exercise. Consider MVI.  Refill of COCs. Brochure for [employer] counseling.  Follow up annually and prn.   After visit summary provided.

## 2022-09-12 NOTE — Patient Instructions (Signed)

## 2023-09-04 NOTE — Progress Notes (Signed)
27 y.o. G69P0 Married Caucasian female here for annual exam.    Breaking out of her chest.  Breasts are sensitive.   No new exposures.   Has had some hair loss.  Taking zinc.  She is worried that her cortisol is abnormal.  She declines evaluation for hair loss today.  She will see her PCP.   Happy with her birth control pills.  Has period every 3 months.   Still has headaches.  They are improved overall and she states she is functional.   Having sleep issues and is taking Trazodone for this.   Just married on October 4.   PCP: Marinda Elk, MD   Patient's last menstrual period was 09/01/2023.     Period Cycle (Days): 84 Period Duration (Days): 5-7 Period Pattern: Regular Menstrual Flow: Heavy Menstrual Control: Maxi pad, Tampon Dysmenorrhea: (!) Moderate     Sexually active: Yes.    The current method of family planning is seasonale.    Exercising: Yes.     Horse back riding, walking dogs Smoker:  no  OB History  Gravida Para Term Preterm AB Living  0            SAB IAB Ectopic Multiple Live Births                Health Maintenance: Pap:  06-01-20 neg HPV HR neg, 04-25-17 neg  History of abnormal Pap:  no MMG: n/a Colonoscopy:   HM Colonoscopy     This patient has no relevant Health Maintenance data.      BMD:  n/a  Result  n/a  HIV: 06/01/20 neg Hep C: 06/01/20 neg  Immunization History  Administered Date(s) Administered   HPV Quadrivalent 03/30/2013, 06/08/2013, 10/01/2013      reports that she has never smoked. She has never used smokeless tobacco. She reports current alcohol use of about 2.0 standard drinks of alcohol per week. She reports that she does not use drugs.  Past Medical History:  Diagnosis Date   Allergy to alpha-gal    Anxiety    Colitis    Depression    GERD (gastroesophageal reflux disease)    Migraine    no aura.     Past Surgical History:  Procedure Laterality Date   ADENOIDECTOMY  1998   eustacian tubes as infant      LABIOPLASTY Right 04/29/2013   Procedure: right labia reduction;  Surgeon: Melony Overly, MD;  Location: [facility];  Service: Gynecology;  Laterality: Right;   wisdom teeth removal      Current Outpatient Medications  Medication Sig Dispense Refill   EPINEPHrine (AUVI-Q) 0.3 mg/0.3 mL IJ SOAJ injection Inject 0.3 mg into the muscle as needed for anaphylaxis. 2 each 1   levonorgestrel-ethinyl estradiol (SEASONALE) 0.15-0.03 MG tablet Take 1 tablet by mouth daily. 91 tablet 4   omeprazole (PRILOSEC) 10 MG capsule Take 10 mg by mouth as needed.     propranolol (INDERAL) 20 MG tablet 1 tablet Orally Up to Twice a day as needed for 90 days     sertraline (ZOLOFT) 100 MG tablet 1 tablet Orally Once a day for 90 days     traZODone (DESYREL) 50 MG tablet 1-2 tablet at bedtime as needed Orally Once a day for 30 days     No current facility-administered medications for this visit.    Family History  Problem Relation Age of Onset   Osteoarthritis Mother    Migraines Mother    Diabetes Mother  Osteoarthritis Father    Osteoarthritis Maternal Grandfather    Hypertension Maternal Grandfather    Hyperlipidemia Maternal Grandfather    Stroke Maternal Grandfather    Thyroid disease Maternal Grandfather    Throat cancer Maternal Grandfather    Osteoarthritis Paternal Grandmother    Osteoarthritis Paternal Grandfather    Eczema Neg Hx    Immunodeficiency Neg Hx    Urticaria Neg Hx     Review of Systems  All other systems reviewed and are negative.   Exam:   BP 122/80 (BP Location: Left Arm, Patient Position: Sitting, Cuff Size: Normal)   Pulse 77   Ht 5' 6.5" (1.689 m)   Wt 164 lb (74.4 kg)   LMP 09/01/2023   SpO2 98%   BMI 26.07 kg/m     General appearance: alert, cooperative and appears stated age Head: normocephalic, without obvious abnormality, atraumatic Neck: no adenopathy, supple, symmetrical, trachea midline and thyroid normal to inspection and palpation Lungs: clear to  auscultation bilaterally Breasts: normal appearance, no masses or tenderness, No nipple retraction or dimpling, No nipple discharge or bleeding, No axillary adenopathy Heart: regular rate and rhythm Abdomen: soft, non-tender; no masses, no organomegaly Extremities: extremities normal, atraumatic, no cyanosis or edema Skin: skin color, texture, turgor normal. No rashes or lesions.  Mild acne between breasts in midline.  Lymph nodes: cervical, supraclavicular, and axillary nodes normal. Neurologic: grossly normal  Pelvic: External genitalia:  no lesions              No abnormal inguinal nodes palpated.              Urethra:  normal appearing urethra with no masses, tenderness or lesions              Bartholins and Skenes: normal                 Vagina: normal appearing vagina with normal color and discharge, no lesions              Cervix: no lesions              Pap taken: no Bimanual Exam:  Uterus:  normal size, contour, position, consistency, mobility, non-tender              Adnexa: no mass, fullness, tenderness         Chaperone was present for exam:  Warren Lacy, CMA   Assessment:  Well woman with GYN exam.  Hx thyromegaly.  Normal thyroid US.  Normal TSH and T4.  Migraine without aura.  Hx pyelonephritis.  Hair loss.  Declines evaluation.   Plan: Mammogram screening discussed. Self breast awareness reviewed. Guidelines for Calcium, Vitamin D, regular exercise program including cardiovascular and weight bearing exercise. Pap and HR HPV in 2026.  Refill of Seasonale for one year.  FU yearly and prn.

## 2023-09-18 ENCOUNTER — Encounter: Payer: Self-pay | Admitting: Obstetrics and Gynecology

## 2023-09-18 ENCOUNTER — Ambulatory Visit (INDEPENDENT_AMBULATORY_CARE_PROVIDER_SITE_OTHER): Payer: Self-pay | Admitting: Obstetrics and Gynecology

## 2023-09-18 VITALS — BP 122/80 | HR 77 | Ht 66.5 in | Wt 164.0 lb

## 2023-09-18 DIAGNOSIS — Z01419 Encounter for gynecological examination (general) (routine) without abnormal findings: Secondary | ICD-10-CM

## 2023-09-18 MED ORDER — LEVONORGEST-ETH ESTRAD 91-DAY 0.15-0.03 MG PO TABS: 1.0000 | ORAL_TABLET | Freq: Every day | ORAL | 4 refills | Status: DC

## 2023-09-18 NOTE — Patient Instructions (Signed)

## 2023-10-18 ENCOUNTER — Encounter: Payer: Self-pay | Admitting: Obstetrics and Gynecology

## 2023-11-20 NOTE — Progress Notes (Signed)
GYNECOLOGY  VISIT   HPI: 28 y.o.   Single  Caucasian female   G0P0 with Patient's last menstrual period was 11/20/2023.   here for: [facility] consult.  Interested in Mirena IUD.   On Seasonale and having monthly cycle for the last few months.  No missed pills.  Prior to this had regular cycles.    Not sexually active for months.  Declines childbearing.   Will see derm for acne.  UPT neg today.   GYNECOLOGIC HISTORY: Patient's last menstrual period was 11/20/2023. Contraception:  OCP Menopausal hormone therapy:  n/a Last 2 paps:  06-01-20 neg HPV HR neg, 04-25-17 neg  History of abnormal Pap or positive HPV:  no Mammogram:  n/a        OB History     Gravida  0   Para      Term      Preterm      AB      Living         SAB      IAB      Ectopic      Multiple      Live Births                 Patient Active Problem List   Diagnosis Date Noted   Food allergy 09/13/2020   Depression with suicidal ideation 10/27/2013    Past Medical History:  Diagnosis Date   Allergy to alpha-gal    Anxiety    Colitis    Depression    GERD (gastroesophageal reflux disease)    Migraine    no aura.     Past Surgical History:  Procedure Laterality Date   ADENOIDECTOMY  1998   eustacian tubes as infant     LABIOPLASTY Right 04/29/2013   Procedure: right labia reduction;  Surgeon: Melony Overly, MD;  Location: [facility];  Service: Gynecology;  Laterality: Right;   wisdom teeth removal      Current Outpatient Medications  Medication Sig Dispense Refill   EPINEPHrine (AUVI-Q) 0.3 mg/0.3 mL IJ SOAJ injection Inject 0.3 mg into the muscle as needed for anaphylaxis. 2 each 1   levonorgestrel-ethinyl estradiol (SEASONALE) 0.15-0.03 MG tablet Take 1 tablet by mouth daily. 91 tablet 4   omeprazole (PRILOSEC) 10 MG capsule Take 10 mg by mouth as needed.     propranolol (INDERAL) 20 MG tablet 1 tablet Orally Up to Twice a day as needed for 90 days     sertraline (ZOLOFT) 100  MG tablet 1 tablet Orally Once a day for 90 days     traZODone (DESYREL) 50 MG tablet 1-2 tablet at bedtime as needed Orally Once a day for 30 days     No current facility-administered medications for this visit.     ALLERGIES: Other  Family History  Problem Relation Age of Onset   Osteoarthritis Mother    Migraines Mother    Diabetes Mother    Osteoarthritis Father    Osteoarthritis Maternal Grandfather    Hypertension Maternal Grandfather    Hyperlipidemia Maternal Grandfather    Stroke Maternal Grandfather    Thyroid disease Maternal Grandfather    Throat cancer Maternal Grandfather    Osteoarthritis Paternal Grandmother    Osteoarthritis Paternal Grandfather    Eczema Neg Hx    Immunodeficiency Neg Hx    Urticaria Neg Hx     Social History   Socioeconomic History   Marital status: Married    Spouse name: Not  on file   Number of children: Not on file   Years of education: Not on file   Highest education level: Not on file  Occupational History   Not on file  Tobacco Use   Smoking status: Never   Smokeless tobacco: Never  Vaping Use   Vaping status: Never Used  Substance and Sexual Activity   Alcohol use: Yes    Alcohol/week: 2.0 standard drinks of alcohol    Types: 2 Standard drinks or equivalent per week   Drug use: No   Sexual activity: Yes    Partners: Male    Birth control/protection: OCP  Other Topics Concern   Not on file  Social History Narrative   Not on file   Social Drivers of Health   Financial Resource Strain: Not on file  Food Insecurity: Not on file  Transportation Needs: Not on file  Physical Activity: Not on file  Stress: Not on file  Social Connections: Not on file  Intimate Partner Violence: Not on file    Review of Systems  All other systems reviewed and are negative.   PHYSICAL EXAMINATION:   BP 126/82 (BP Location: Left Arm, Patient Position: Sitting, Cuff Size: Small)   Ht 5' 6.5" (1.689 m)   Wt 164 lb (74.4 kg)   LMP  11/20/2023   BMI 26.07 kg/m     General appearance: alert, cooperative and appears stated age  ASSESSMENT:  Breakthrough bleeding on birth control pills.  Contraceptive counseling.   PLAN:  We reviewed contraceptive choices of pills, patch, ring, IUDs.  IUDs discussed in detail including risks and benefits:  bleeding profiles, perforation with placement, expulsion, ultrasound guided or surgically guided removal, risk of ectopic pregnancy, PID if exposed to an STD, quick return to fertility with removal.  Patient wishes to proceed with Mirena insertion.  Mirena brochure given.  Will plan for Motrin 800 mg, Cytotec 200 mcg pv the night before the insertion and then the am of the insertion, paracervical block. She will continue her birth control pills for now.    27 min  total time was spent for this patient encounter, including preparation, face-to-face counseling with the patient, coordination of care, and documentation of the encounter.

## 2023-12-04 ENCOUNTER — Ambulatory Visit (INDEPENDENT_AMBULATORY_CARE_PROVIDER_SITE_OTHER): Payer: Managed Care, Other (non HMO) | Admitting: Obstetrics and Gynecology

## 2023-12-04 ENCOUNTER — Encounter: Payer: Self-pay | Admitting: Obstetrics and Gynecology

## 2023-12-04 VITALS — BP 126/82 | Ht 66.5 in | Wt 164.0 lb

## 2023-12-04 DIAGNOSIS — N921 Excessive and frequent menstruation with irregular cycle: Secondary | ICD-10-CM | POA: Diagnosis not present

## 2023-12-04 DIAGNOSIS — Z308 Encounter for other contraceptive management: Secondary | ICD-10-CM | POA: Diagnosis not present

## 2023-12-04 MED ORDER — MISOPROSTOL 200 MCG PO TABS: ORAL_TABLET | ORAL | 0 refills | Status: DC

## 2023-12-04 NOTE — Patient Instructions (Signed)
Intrauterine Device (IUD) Insertion: What to Expect  An intrauterine device (IUD) is put in (inserted) your uterus to prevent pregnancy. It's a small, T-shaped device that has one or two nylon strings hanging down from it. The strings hang out of your cervix, which is the lowest part of your uterus. Tell a health care provider about: Any allergies you have. All medicines you take. These include vitamins, herbs, eye drops, and creams. Any surgeries you have had. Any medical problems you have. Whether you're pregnant or may be pregnant. What are the risks? Your health care provider will talk with you about risks. These may include: Infection. Bleeding. Allergic reactions to medicines. A cut to the uterus, also called perforation, or damage to other structures or organs. Accidental placement of the IUD either in the muscle layer of the uterus or outside the uterus. The IUD falling out of the uterus. This is more common if you recently had a baby. Higher risk of ectopic pregnancy. This is when an egg is fertilized outside your uterus. This is rare. Pelvic inflammatory disease (PID). This is an infection in the uterus and fallopian tubes. The IUD doesn't cause the infection. The infection is usually from a sexually transmitted infection (STI). If this happens, it is usually during the first 20 days after the IUD is put in. This is rare. What happens before? Ask about changing or stopping: Any medicines you take. Any vitamins, herbs, or supplements you take. Your provider may tell you to take pain medicines you can buy at the store before the procedure. You may have tests for: Pregnancy. You may have a pee (urine) or blood sample taken. STIs. Placing an IUD can make an infection worse. To check for cervical cancer. You may have a Pap test, which is when cells from your cervix are removed for testing. What happens during an IUD insertion? A tool, called a speculum, will be placed in your  vagina and widened so that your provider can see your cervix. A medicine to clean your cervix may be used to help lower your risk of infection. You may be given medicine to numb your cervix. This medicine is usually given by an injection into your cervix. A tool will be put into your uterus to check the length of your uterus. A thin tube that holds the IUD will be put into your vagina, through the opening of your cervix, and into your uterus. The IUD will be placed in your uterus. The tube that holds the IUD will be removed. The strings that are attached to the IUD will be trimmed so that they sit just outside your cervix. The speculum will be removed. These steps may vary. Ask what you can expect. What happens after? You may have: Bleeding. It can vary from light bleeding or spotting for a few days to period-like bleeding. This is normal. Cramps and pain in your belly. Dizziness or light-headedness. Pain in your lower back. Headaches and the feeling like you may throw up Follow these instructions at home: Do not have sex or put anything into your vagina for 24 hours after the IUD is placed. Before having sex, check to make sure that you can feel the IUD string or strings. You should be able to feel the end of the string below the opening of your cervix. If your IUD string is in place, you may continue with sex. If you had a hormonal IUD put in more than 7 days after your most recent period  started, you will need to use a backup method of birth control for 7 days after the IUD was placed. Hormones are chemicals that affect how the body works. Check that the IUD is still in place by feeling for the strings after every period, or check once a month. Use a condom every time you have sex to prevent STIs. An IUD won't protect you from STIs. Take your medicines only as told. Contact a health care provider if: You have any of the following problems with your IUD string or strings: The string  bothers or hurts you or your sexual partner. You can't feel the string. The string has gotten longer. The IUD comes out or you can feel the IUD in your vagina. You think you may be pregnant, or you miss your period. You think you may have an STI. You have bad-smelling discharge from your vagina. You have a fever and chills. You have pain during sex. Get help right away if: You have heavy bleeding, which means soaking more than 2 pads per hour for 2 hours in a row. You have sudden, really bad belly pain. This information is not intended to replace advice given to you by your health care provider. Make sure you discuss any questions you have with your health care provider. Document Revised: 07/08/2023 Document Reviewed: 07/08/2023 Elsevier Patient Education  2024 [employer].

## 2023-12-05 LAB — PREGNANCY, URINE: Preg Test, Ur: NEGATIVE

## 2023-12-05 NOTE — Addendum Note (Signed)
Addended by: Melrose Nakayama on: 12/05/2023 09:20 AM   Modules accepted: Orders

## 2023-12-09 NOTE — Progress Notes (Signed)
GYNECOLOGY  VISIT   HPI: 28 y.o.   Married  Caucasian female   G0P0 with Patient's last menstrual period was 11/20/2023.   here for: Mirena IUD insert.  Mother present.   Took Ibuprofen 800 mg prior to visit.   Finished her birth control pills.     UPT negative.  GYNECOLOGIC HISTORY: Patient's last menstrual period was 11/20/2023. Contraception:  OCP Menopausal hormone therapy:  n/a Last 2 paps:   06-01-20 neg HPV HR neg, 04-25-17 neg  History of abnormal Pap or positive HPV:  no Mammogram:  n/a        OB History     Gravida  0   Para      Term      Preterm      AB      Living         SAB      IAB      Ectopic      Multiple      Live Births                 Patient Active Problem List   Diagnosis Date Noted   Food allergy 09/13/2020   Depression with suicidal ideation 10/27/2013    Past Medical History:  Diagnosis Date   Allergy to alpha-gal    Anxiety    Colitis    Depression    GERD (gastroesophageal reflux disease)    Migraine    no aura.     Past Surgical History:  Procedure Laterality Date   ADENOIDECTOMY  1998   eustacian tubes as infant     LABIOPLASTY Right 04/29/2013   Procedure: right labia reduction;  Surgeon: Melony Overly, MD;  Location: [facility];  Service: Gynecology;  Laterality: Right;   wisdom teeth removal      Current Outpatient Medications  Medication Sig Dispense Refill   EPINEPHrine (AUVI-Q) 0.3 mg/0.3 mL IJ SOAJ injection Inject 0.3 mg into the muscle as needed for anaphylaxis. 2 each 1   misoprostol (CYTOTEC) 200 MCG tablet Place one tablet (200 mcg) in the vagina the night before the IUD insertion and then place one tablet (200 mcg) in the vagina the morning of the IUD insertion. 2 tablet 0   omeprazole (PRILOSEC) 10 MG capsule Take 10 mg by mouth as needed.     propranolol (INDERAL) 20 MG tablet 1 tablet Orally Up to Twice a day as needed for 90 days     sertraline (ZOLOFT) 100 MG tablet 1 tablet Orally Once a  day for 90 days     traZODone (DESYREL) 50 MG tablet 1-2 tablet at bedtime as needed Orally Once a day for 30 days     No current facility-administered medications for this visit.     ALLERGIES: Other  Family History  Problem Relation Age of Onset   Osteoarthritis Mother    Migraines Mother    Diabetes Mother    Osteoarthritis Father    Osteoarthritis Maternal Grandfather    Hypertension Maternal Grandfather    Hyperlipidemia Maternal Grandfather    Stroke Maternal Grandfather    Thyroid disease Maternal Grandfather    Throat cancer Maternal Grandfather    Osteoarthritis Paternal Grandmother    Osteoarthritis Paternal Grandfather    Eczema Neg Hx    Immunodeficiency Neg Hx    Urticaria Neg Hx     Social History   Socioeconomic History   Marital status: Married    Spouse name: Not on file  Number of children: Not on file   Years of education: Not on file   Highest education level: Not on file  Occupational History   Not on file  Tobacco Use   Smoking status: Never   Smokeless tobacco: Never  Vaping Use   Vaping status: Never Used  Substance and Sexual Activity   Alcohol use: Yes    Alcohol/week: 2.0 standard drinks of alcohol    Types: 2 Standard drinks or equivalent per week   Drug use: No   Sexual activity: Yes    Partners: Male    Birth control/protection: OCP  Other Topics Concern   Not on file  Social History Narrative   Not on file   Social Drivers of Health   Financial Resource Strain: Not on file  Food Insecurity: Not on file  Transportation Needs: Not on file  Physical Activity: Not on file  Stress: Not on file  Social Connections: Not on file  Intimate Partner Violence: Not on file    Review of Systems  All other systems reviewed and are negative.   PHYSICAL EXAMINATION:   BP 118/76 (BP Location: Left Arm, Patient Position: Sitting, Cuff Size: Small)   Pulse 93   Ht 5' 6.5" (1.689 m)   Wt 164 lb (74.4 kg)   LMP 11/20/2023   SpO2  97%   BMI 26.07 kg/m     General appearance: alert, cooperative and appears stated age   Pelvic: External genitalia:  no lesions              Urethra:  normal appearing urethra with no masses, tenderness or lesions              Bartholins and Skenes: normal                 Vagina: normal appearing vagina with normal color and discharge, no lesions              Cervix: no lesions                Bimanual Exam:  Uterus:  normal size, contour, position, consistency, mobility, non-tender              Adnexa: no mass, fullness, tenderness    Mirena IUD insertion.  Lot [id number], exp Dec 2026.  Consent and time out done.  Sterile prep with Hibiclens.  Paracervical block with 10 cc 1% lidocaine, lot [id number], exp 07/2025. Tenaculum to anterior cervical lip.  Mirena IUD inserted to 7 cm.  Strings trimmed and shown to patient. No complications.  Minimal EBL.  Repeat bimanual exam, no change.   Chaperone was present for exam:  Warren Lacy, CMA  ASSESSMENT:  Mirena IUD insertion.   PLAN:  IUD card and IUD brochure to patient.  Post IUD insertion precautions given.  Back up protection for one week.  FU in 4 week for IUD check up.

## 2023-12-23 ENCOUNTER — Ambulatory Visit (INDEPENDENT_AMBULATORY_CARE_PROVIDER_SITE_OTHER): Payer: Managed Care, Other (non HMO) | Admitting: Obstetrics and Gynecology

## 2023-12-23 ENCOUNTER — Encounter: Payer: Self-pay | Admitting: Obstetrics and Gynecology

## 2023-12-23 VITALS — BP 118/76 | HR 93 | Ht 66.5 in | Wt 164.0 lb

## 2023-12-23 DIAGNOSIS — Z30430 Encounter for insertion of intrauterine contraceptive device: Secondary | ICD-10-CM

## 2023-12-23 DIAGNOSIS — Z01812 Encounter for preprocedural laboratory examination: Secondary | ICD-10-CM

## 2023-12-23 DIAGNOSIS — Z308 Encounter for other contraceptive management: Secondary | ICD-10-CM

## 2023-12-23 LAB — PREGNANCY, URINE: Preg Test, Ur: NEGATIVE

## 2023-12-23 MED ORDER — LEVONORGESTREL 20 MCG/DAY IU IUD
1.0000 | INTRAUTERINE_SYSTEM | Freq: Once | INTRAUTERINE | Status: AC
  Administered 2023-12-23: 1 via INTRAUTERINE

## 2023-12-25 NOTE — Patient Instructions (Signed)
Intrauterine Device (IUD) Insertion: What to Expect  An intrauterine device (IUD) is put in (inserted) your uterus to prevent pregnancy. It's a small, T-shaped device that has one or two nylon strings hanging down from it. The strings hang out of your cervix, which is the lowest part of your uterus. Tell a health care provider about: Any allergies you have. All medicines you take. These include vitamins, herbs, eye drops, and creams. Any surgeries you have had. Any medical problems you have. Whether you're pregnant or may be pregnant. What are the risks? Your health care provider will talk with you about risks. These may include: Infection. Bleeding. Allergic reactions to medicines. A cut to the uterus, also called perforation, or damage to other structures or organs. Accidental placement of the IUD either in the muscle layer of the uterus or outside the uterus. The IUD falling out of the uterus. This is more common if you recently had a baby. Higher risk of ectopic pregnancy. This is when an egg is fertilized outside your uterus. This is rare. Pelvic inflammatory disease (PID). This is an infection in the uterus and fallopian tubes. The IUD doesn't cause the infection. The infection is usually from a sexually transmitted infection (STI). If this happens, it is usually during the first 20 days after the IUD is put in. This is rare. What happens before? Ask about changing or stopping: Any medicines you take. Any vitamins, herbs, or supplements you take. Your provider may tell you to take pain medicines you can buy at the store before the procedure. You may have tests for: Pregnancy. You may have a pee (urine) or blood sample taken. STIs. Placing an IUD can make an infection worse. To check for cervical cancer. You may have a Pap test, which is when cells from your cervix are removed for testing. What happens during an IUD insertion? A tool, called a speculum, will be placed in your  vagina and widened so that your provider can see your cervix. A medicine to clean your cervix may be used to help lower your risk of infection. You may be given medicine to numb your cervix. This medicine is usually given by an injection into your cervix. A tool will be put into your uterus to check the length of your uterus. A thin tube that holds the IUD will be put into your vagina, through the opening of your cervix, and into your uterus. The IUD will be placed in your uterus. The tube that holds the IUD will be removed. The strings that are attached to the IUD will be trimmed so that they sit just outside your cervix. The speculum will be removed. These steps may vary. Ask what you can expect. What happens after? You may have: Bleeding. It can vary from light bleeding or spotting for a few days to period-like bleeding. This is normal. Cramps and pain in your belly. Dizziness or light-headedness. Pain in your lower back. Headaches and the feeling like you may throw up Follow these instructions at home: Do not have sex or put anything into your vagina for 24 hours after the IUD is placed. Before having sex, check to make sure that you can feel the IUD string or strings. You should be able to feel the end of the string below the opening of your cervix. If your IUD string is in place, you may continue with sex. If you had a hormonal IUD put in more than 7 days after your most recent period  started, you will need to use a backup method of birth control for 7 days after the IUD was placed. Hormones are chemicals that affect how the body works. Check that the IUD is still in place by feeling for the strings after every period, or check once a month. Use a condom every time you have sex to prevent STIs. An IUD won't protect you from STIs. Take your medicines only as told. Contact a health care provider if: You have any of the following problems with your IUD string or strings: The string  bothers or hurts you or your sexual partner. You can't feel the string. The string has gotten longer. The IUD comes out or you can feel the IUD in your vagina. You think you may be pregnant, or you miss your period. You think you may have an STI. You have bad-smelling discharge from your vagina. You have a fever and chills. You have pain during sex. Get help right away if: You have heavy bleeding, which means soaking more than 2 pads per hour for 2 hours in a row. You have sudden, really bad belly pain. This information is not intended to replace advice given to you by your health care provider. Make sure you discuss any questions you have with your health care provider. Document Revised: 07/08/2023 Document Reviewed: 07/08/2023 Elsevier Patient Education  2024 [employer].

## 2024-01-06 ENCOUNTER — Encounter: Payer: Self-pay | Admitting: Obstetrics and Gynecology

## 2024-01-06 NOTE — Progress Notes (Signed)
 GYNECOLOGY  VISIT   HPI: 28 y.o.   Married  Caucasian female   G0P0 with Patient's last menstrual period was 01/20/2024.   here for: IUD checkup-- pt reports regular bleeding since insertion with one week off.  Spotted for one week and then bled regular amount for 2 weeks.  Changes tampon and pad three to four times a day.   She did have one week of no bleeding.   No pain or discomfort.   Not sexually active since the IUD was placed.   Wants the strings cut shorter.   Stopped taking her birth control pills around Feb 1st.    Having some hair loss for the last 2 years.  She has had normal TFTs with PCP.  Under stress at work.  Taking a iron supplement.   She will see dermatology.   GYNECOLOGIC HISTORY: Patient's last menstrual period was 01/20/2024. Contraception:  IUD--Mirena 12/23/23 Menopausal hormone therapy:  n/a Last 2 paps:  06-01-20 neg HPV HR neg, 04-25-17 neg  History of abnormal Pap or positive HPV:  no Mammogram:  n/a        OB History     Gravida  0   Para      Term      Preterm      AB      Living         SAB      IAB      Ectopic      Multiple      Live Births                 Patient Active Problem List   Diagnosis Date Noted   Food allergy 09/13/2020   Depression with suicidal ideation 10/27/2013    Past Medical History:  Diagnosis Date   Allergy to alpha-gal    Anxiety    Colitis    Depression    GERD (gastroesophageal reflux disease)    Migraine    no aura.     Past Surgical History:  Procedure Laterality Date   ADENOIDECTOMY  1998   eustacian tubes as infant     LABIOPLASTY Right 04/29/2013   Procedure: right labia reduction;  Surgeon: Melony Overly, MD;  Location: [facility];  Service: Gynecology;  Laterality: Right;   wisdom teeth removal      Current Outpatient Medications  Medication Sig Dispense Refill   EPINEPHrine (AUVI-Q) 0.3 mg/0.3 mL IJ SOAJ injection Inject 0.3 mg into the muscle as needed for  anaphylaxis. 2 each 1   omeprazole (PRILOSEC) 10 MG capsule Take 10 mg by mouth as needed.     propranolol (INDERAL) 20 MG tablet 1 tablet Orally Up to Twice a day as needed for 90 days     sertraline (ZOLOFT) 100 MG tablet 1 tablet Orally Once a day for 90 days     traZODone (DESYREL) 50 MG tablet 1-2 tablet at bedtime as needed Orally Once a day for 30 days     No current facility-administered medications for this visit.     ALLERGIES: Other  Family History  Problem Relation Age of Onset   Osteoarthritis Mother    Migraines Mother    Diabetes Mother    Osteoarthritis Father    Osteoarthritis Maternal Grandfather    Hypertension Maternal Grandfather    Hyperlipidemia Maternal Grandfather    Stroke Maternal Grandfather    Thyroid disease Maternal Grandfather    Throat cancer Maternal Grandfather    Osteoarthritis  Paternal Grandmother    Osteoarthritis Paternal Grandfather    Eczema Neg Hx    Immunodeficiency Neg Hx    Urticaria Neg Hx     Social History   Socioeconomic History   Marital status: Married    Spouse name: Not on file   Number of children: Not on file   Years of education: Not on file   Highest education level: Not on file  Occupational History   Not on file  Tobacco Use   Smoking status: Never   Smokeless tobacco: Never  Vaping Use   Vaping status: Never Used  Substance and Sexual Activity   Alcohol use: Yes    Alcohol/week: 2.0 standard drinks of alcohol    Types: 2 Standard drinks or equivalent per week   Drug use: No   Sexual activity: Yes    Partners: Male    Birth control/protection: I.U.D.  Other Topics Concern   Not on file  Social History Narrative   Not on file   Social Drivers of Health   Financial Resource Strain: Not on file  Food Insecurity: Not on file  Transportation Needs: Not on file  Physical Activity: Not on file  Stress: Not on file  Social Connections: Not on file  Intimate Partner Violence: Not on file    Review  of Systems  All other systems reviewed and are negative.   PHYSICAL EXAMINATION:   BP 130/82 (BP Location: Right Arm, Patient Position: Sitting, Cuff Size: Small)   Pulse 87   Wt 164 lb (74.4 kg)   LMP 01/20/2024   SpO2 98%   BMI 26.07 kg/m     General appearance: alert, cooperative and appears stated age   Pelvic: External genitalia:  no lesions              Urethra:  normal appearing urethra with no masses, tenderness or lesions              Bartholins and Skenes: normal                 Vagina: normal appearing vagina with normal color and discharge, no lesions              Cervix: no lesions.  IUD strings noted and cut to 2.5 cm length.   Menstrual blood flow noted.                 Bimanual Exam:  Uterus:  normal size, contour, position, consistency, mobility, non-tender              Adnexa: no mass, fullness, tenderness    Chaperone was present for exam:  Warren Lacy, CMA  ASSESSMENT:  IUD check up.  Alopecia.  We discussed stress and discontinuation of birth control pills as contributors to hair loss.   PLAN:  Reassurance regarding Rutha Bouchard IUD bleeding profiles.  She will keep a bleeding calendar.  Declines check of testosterone level.  She will see her new dermatologist.  FU for annual exam and prn.   25 min  total time was spent for this patient encounter, including preparation, face-to-face counseling with the patient, coordination of care, and documentation of the encounter.

## 2024-01-20 ENCOUNTER — Encounter: Payer: Self-pay | Admitting: Obstetrics and Gynecology

## 2024-01-20 ENCOUNTER — Ambulatory Visit (INDEPENDENT_AMBULATORY_CARE_PROVIDER_SITE_OTHER): Payer: Managed Care, Other (non HMO) | Admitting: Obstetrics and Gynecology

## 2024-01-20 VITALS — BP 130/82 | HR 87 | Wt 164.0 lb

## 2024-01-20 DIAGNOSIS — L659 Nonscarring hair loss, unspecified: Secondary | ICD-10-CM

## 2024-01-20 DIAGNOSIS — Z30431 Encounter for routine checking of intrauterine contraceptive device: Secondary | ICD-10-CM | POA: Diagnosis not present

## 2024-09-10 ENCOUNTER — Ambulatory Visit

## 2024-09-10 ENCOUNTER — Other Ambulatory Visit: Payer: Self-pay | Admitting: Family Medicine

## 2024-09-10 ENCOUNTER — Encounter: Payer: Self-pay | Admitting: Family Medicine

## 2024-09-10 DIAGNOSIS — R053 Chronic cough: Secondary | ICD-10-CM | POA: Diagnosis not present

## 2024-09-21 ENCOUNTER — Ambulatory Visit (INDEPENDENT_AMBULATORY_CARE_PROVIDER_SITE_OTHER): Payer: Self-pay | Admitting: Obstetrics and Gynecology

## 2024-09-21 ENCOUNTER — Encounter: Payer: Self-pay | Admitting: Obstetrics and Gynecology

## 2024-09-21 VITALS — BP 120/82 | HR 104 | Ht 66.75 in | Wt 188.0 lb

## 2024-09-21 DIAGNOSIS — Z1331 Encounter for screening for depression: Secondary | ICD-10-CM

## 2024-09-21 DIAGNOSIS — Z01419 Encounter for gynecological examination (general) (routine) without abnormal findings: Secondary | ICD-10-CM

## 2024-09-21 NOTE — Patient Instructions (Signed)

## 2024-09-21 NOTE — Progress Notes (Signed)
 28 y.o. G71P0000 Married Caucasian female here for annual exam. Since being on IUD periods have gotten heavier and noticed some weight gain.   Having monthly menses, lasting longer than when on pills.  First two days are heavy.  Cramping tx with Midol .  Mirena  is easier than when being on birth control pills and wants to continue.     Taking Zoloft and Trazodone .  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  Having marital strife.  Has support.  Has labs done yearly with PCP.    PCP: Brien Charleston, MD   Patient's last menstrual period was 09/16/2024 (exact date).     Period Cycle (Days): 28 Period Duration (Days): 7-10 Period Pattern: Regular Menstrual Flow: Moderate Menstrual Control: Maxi pad, Tampon Dysmenorrhea: (!) Severe Dysmenorrhea Symptoms: Cramping     Sexually active: Yes.    The current method of family planning is IUD Mirena  12/23/23.    Menopausal hormone therapy:  n/a Exercising: Yes.    Working on farm  Smoker:  no  OB History  Gravida Para Term Preterm AB Living  0 0 0 0 0 0  SAB IAB Ectopic Multiple Live Births  0 0 0 0 0     HEALTH MAINTENANCE: Last 2 paps:  06/01/20 neg, HR HPV neg, 04/25/17 neg  History of abnormal Pap or positive HPV:  no Mammogram:   n/a Colonoscopy:  n/a Bone Density:  n/a  Result  n/a   Immunization History  Administered Date(s) Administered   HPV Quadrivalent 03/30/2013, 06/08/2013, 10/01/2013      reports that she has never smoked. She has never used smokeless tobacco. She reports current alcohol use of about 2.0 standard drinks of alcohol per week. She reports that she does not use drugs.  Past Medical History:  Diagnosis Date   Allergy  to alpha-gal    Anxiety    Colitis    Depression    GERD (gastroesophageal reflux disease)    Migraine    no aura.     Past Surgical History:  Procedure Laterality Date   ADENOIDECTOMY  1998   eustacian tubes as infant     LABIOPLASTY Right 04/29/2013   Procedure: right labia  reduction;  Surgeon: Bobie DELENA Cary, MD;  Location: [facility];  Service: Gynecology;  Laterality: Right;   wisdom teeth removal      Current Outpatient Medications  Medication Sig Dispense Refill   EPINEPHrine  (AUVI-Q ) 0.3 mg/0.3 mL IJ SOAJ injection Inject 0.3 mg into the muscle as needed for anaphylaxis. 2 each 1   levonorgestrel  (MIRENA , 52 MG,) 20 MCG/DAY IUD 12/23/23     omeprazole (PRILOSEC) 10 MG capsule Take 10 mg by mouth as needed.     propranolol (INDERAL) 20 MG tablet 1 tablet Orally Up to Twice a day as needed for 90 days     sertraline (ZOLOFT) 100 MG tablet 1 tablet Orally Once a day for 90 days     traZODone  (DESYREL ) 50 MG tablet 1-2 tablet at bedtime as needed Orally Once a day for 30 days     No current facility-administered medications for this visit.    ALLERGIES: Alpha-gal and Other  Family History  Problem Relation Age of Onset   Osteoarthritis Mother    Migraines Mother    Diabetes Mother    Osteoarthritis Father    Osteoarthritis Maternal Grandfather    Hypertension Maternal Grandfather    Hyperlipidemia Maternal Grandfather    Stroke Maternal Grandfather    Thyroid  disease Maternal Grandfather  Throat cancer Maternal Grandfather    Osteoarthritis Paternal Grandmother    Osteoarthritis Paternal Grandfather    Eczema Neg Hx    Immunodeficiency Neg Hx    Urticaria Neg Hx     Review of Systems  All other systems reviewed and are negative.   PHYSICAL EXAM:  BP 120/82 (BP Location: Left Arm, Patient Position: Sitting)   Pulse (!) 104   Ht 5' 6.75 (1.695 m)   Wt 188 lb (85.3 kg)   LMP 09/16/2024 (Exact Date)   SpO2 95%   BMI 29.67 kg/m     General appearance: alert, cooperative and appears stated age Head: normocephalic, without obvious abnormality, atraumatic Neck: no adenopathy, supple, symmetrical, trachea midline and thyroid  normal to inspection and palpation Lungs: clear to auscultation bilaterally Breasts: normal appearance, no masses or  tenderness, No nipple retraction or dimpling, No nipple discharge or bleeding, No axillary adenopathy Heart: regular rate and rhythm Abdomen: soft, non-tender; no masses, no organomegaly Extremities: extremities normal, atraumatic, no cyanosis or edema Skin: skin color, texture, turgor normal. No rashes or lesions Lymph nodes: cervical, supraclavicular, and axillary nodes normal. Neurologic: grossly normal  Pelvic: External genitalia:  no lesions              No abnormal inguinal nodes palpated.              Urethra:  normal appearing urethra with no masses, tenderness or lesions              Bartholins and Skenes: normal                 Vagina: normal appearing vagina with normal color and discharge, no lesions              Cervix: no lesions.  IUD strings noted.  Light bloody drainage noted.               Pap taken: no Bimanual Exam:  Uterus:  normal size, contour, position, consistency, mobility, non-tender              Adnexa: no mass, fullness, tenderness       Chaperone was present for exam:  Kari HERO, CMA  ASSESSMENT: Well woman visit with gynecologic exam. Mirena  IUD.  Hx thyromegaly.  Normal thyroid  US .  Normal TSH and T4.  Migraine without aura.  Hx pyelonephritis.  PHQ-2-9: 0  PLAN: Mammogram screening discussed. Self breast awareness reviewed. Pap and HRV collected:  no.  Due in 2026. Guidelines for Calcium, Vitamin D, regular exercise program including cardiovascular and weight bearing exercise. Medication refills:  NA Labs with PCP. Follow up:  yearly and prn.

## 2025-09-28 ENCOUNTER — Ambulatory Visit: Admitting: Obstetrics and Gynecology
